# Patient Record
Sex: Male | Born: 1970 | Race: White | Hispanic: No | Marital: Single | Smoking: Never smoker
Health system: Southern US, Community
[De-identification: ages and names within clinical notes are randomized; demographics above are authoritative.]

## PROBLEM LIST (undated history)

## (undated) HISTORY — PX: WISDOM TOOTH EXTRACTION: SHX21

---

## 2008-10-11 ENCOUNTER — Encounter: Admission: RE | Admit: 2008-10-11 | Discharge: 2008-10-11 | Payer: Self-pay | Admitting: Family Medicine

## 2010-06-02 DIAGNOSIS — J309 Allergic rhinitis, unspecified: Secondary | ICD-10-CM | POA: Insufficient documentation

## 2010-06-02 DIAGNOSIS — B009 Herpesviral infection, unspecified: Secondary | ICD-10-CM | POA: Insufficient documentation

## 2010-07-05 IMAGING — CR DG CHEST 2V
3 series · 3 of 3 positions shown · non-contrast
Comparison: None

CLINICAL DATA: Cough and fever.  Congestion for 2 weeks.

CHEST - 2 VIEW

[view not recorded (1 of 3)]
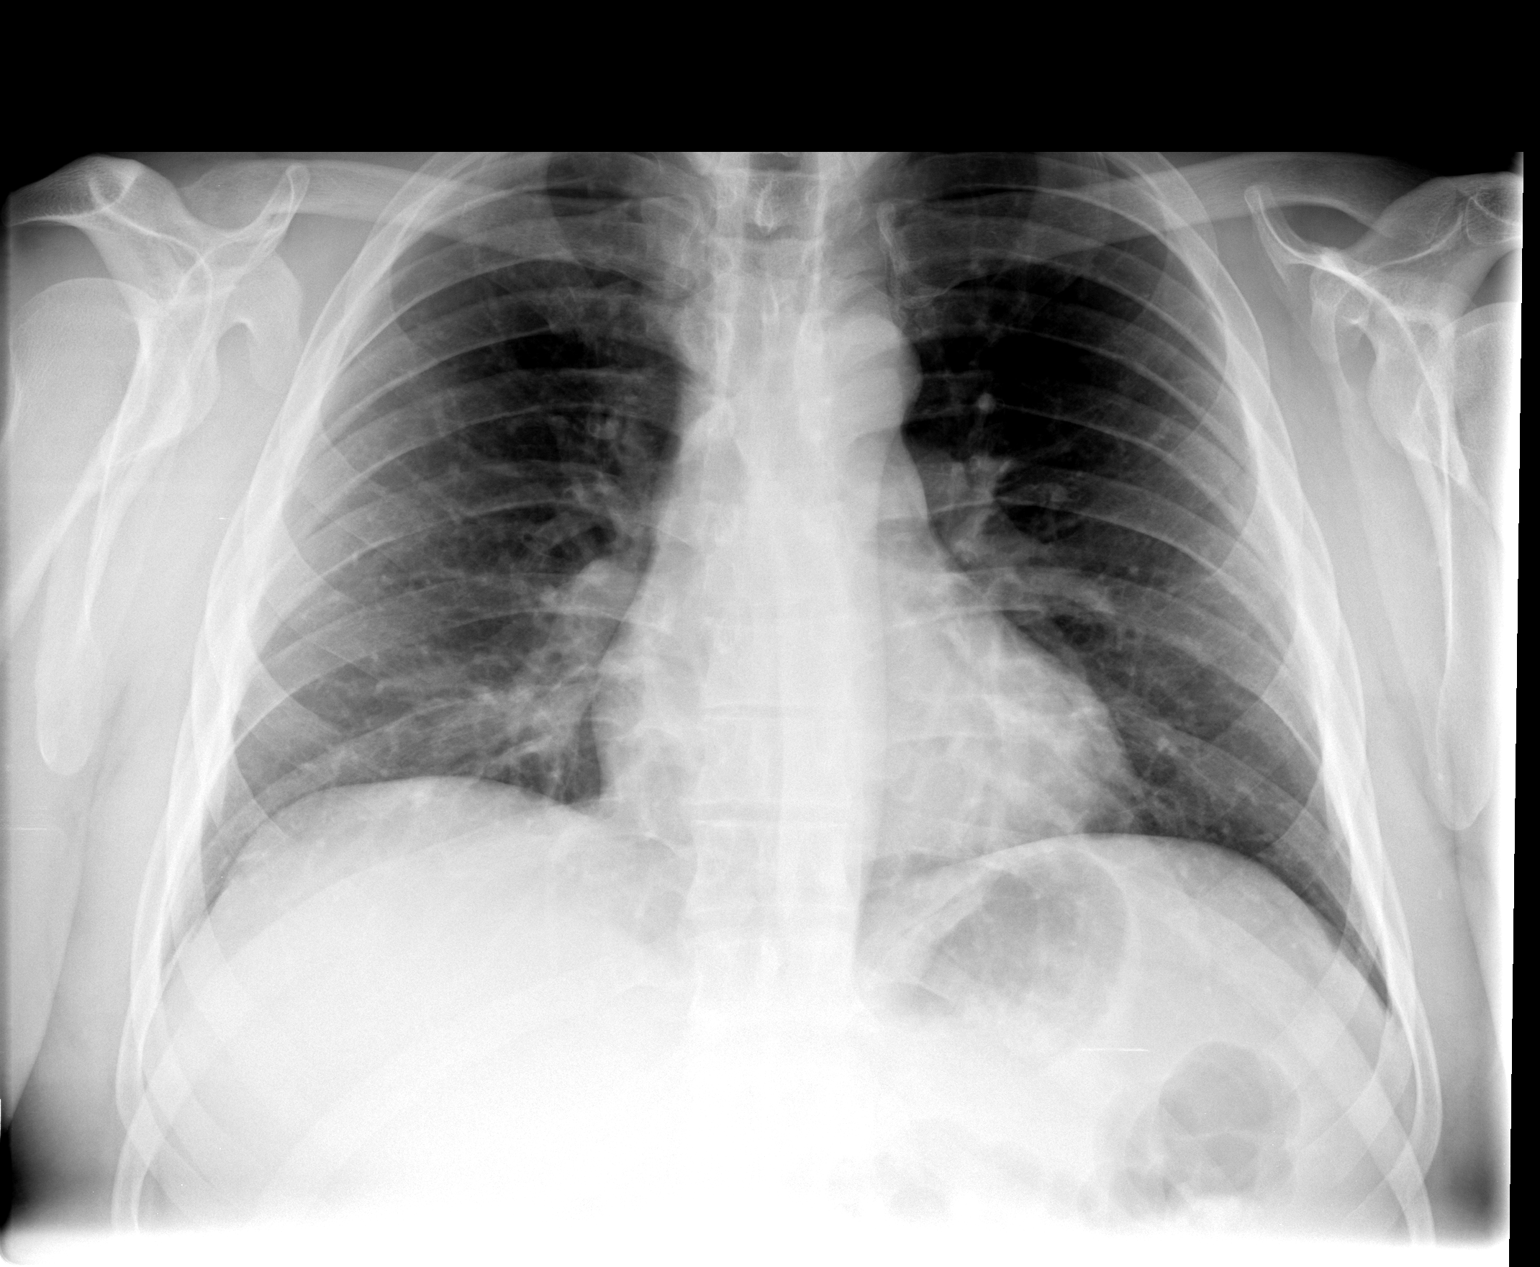

[view not recorded (2 of 3)]
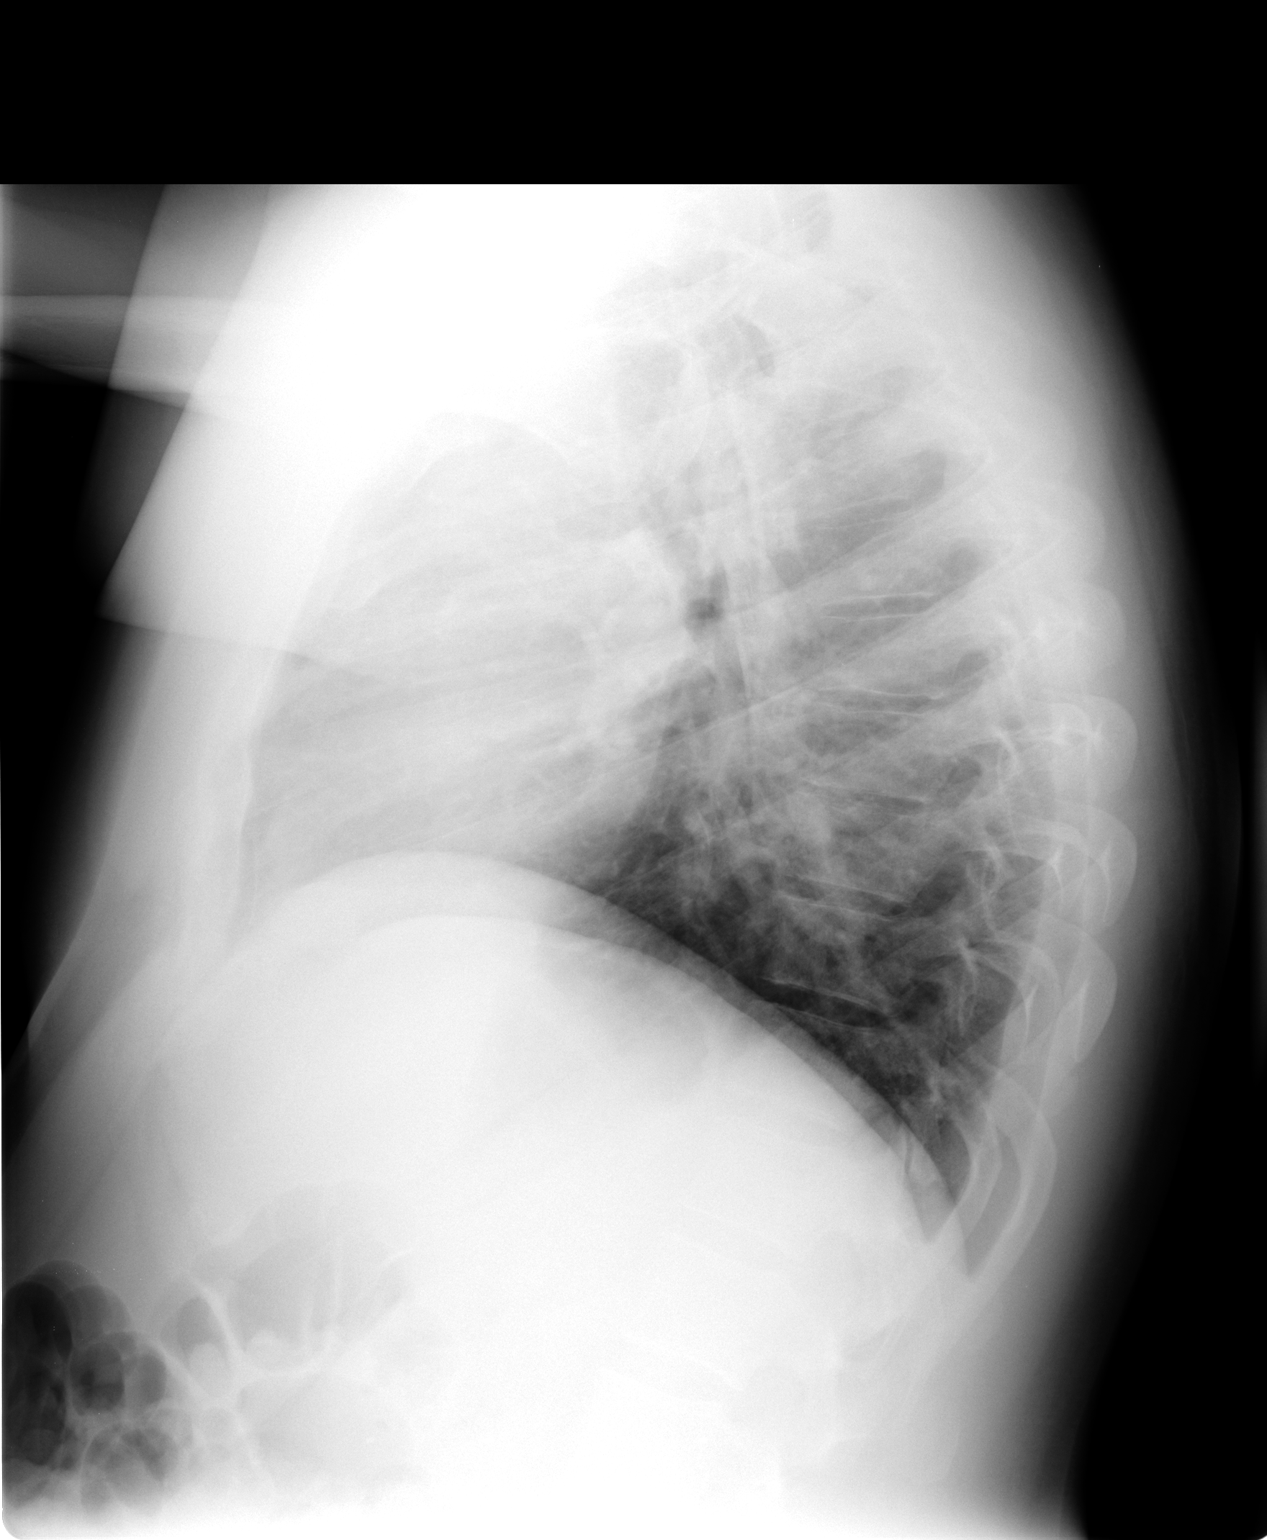

[view not recorded (3 of 3)]
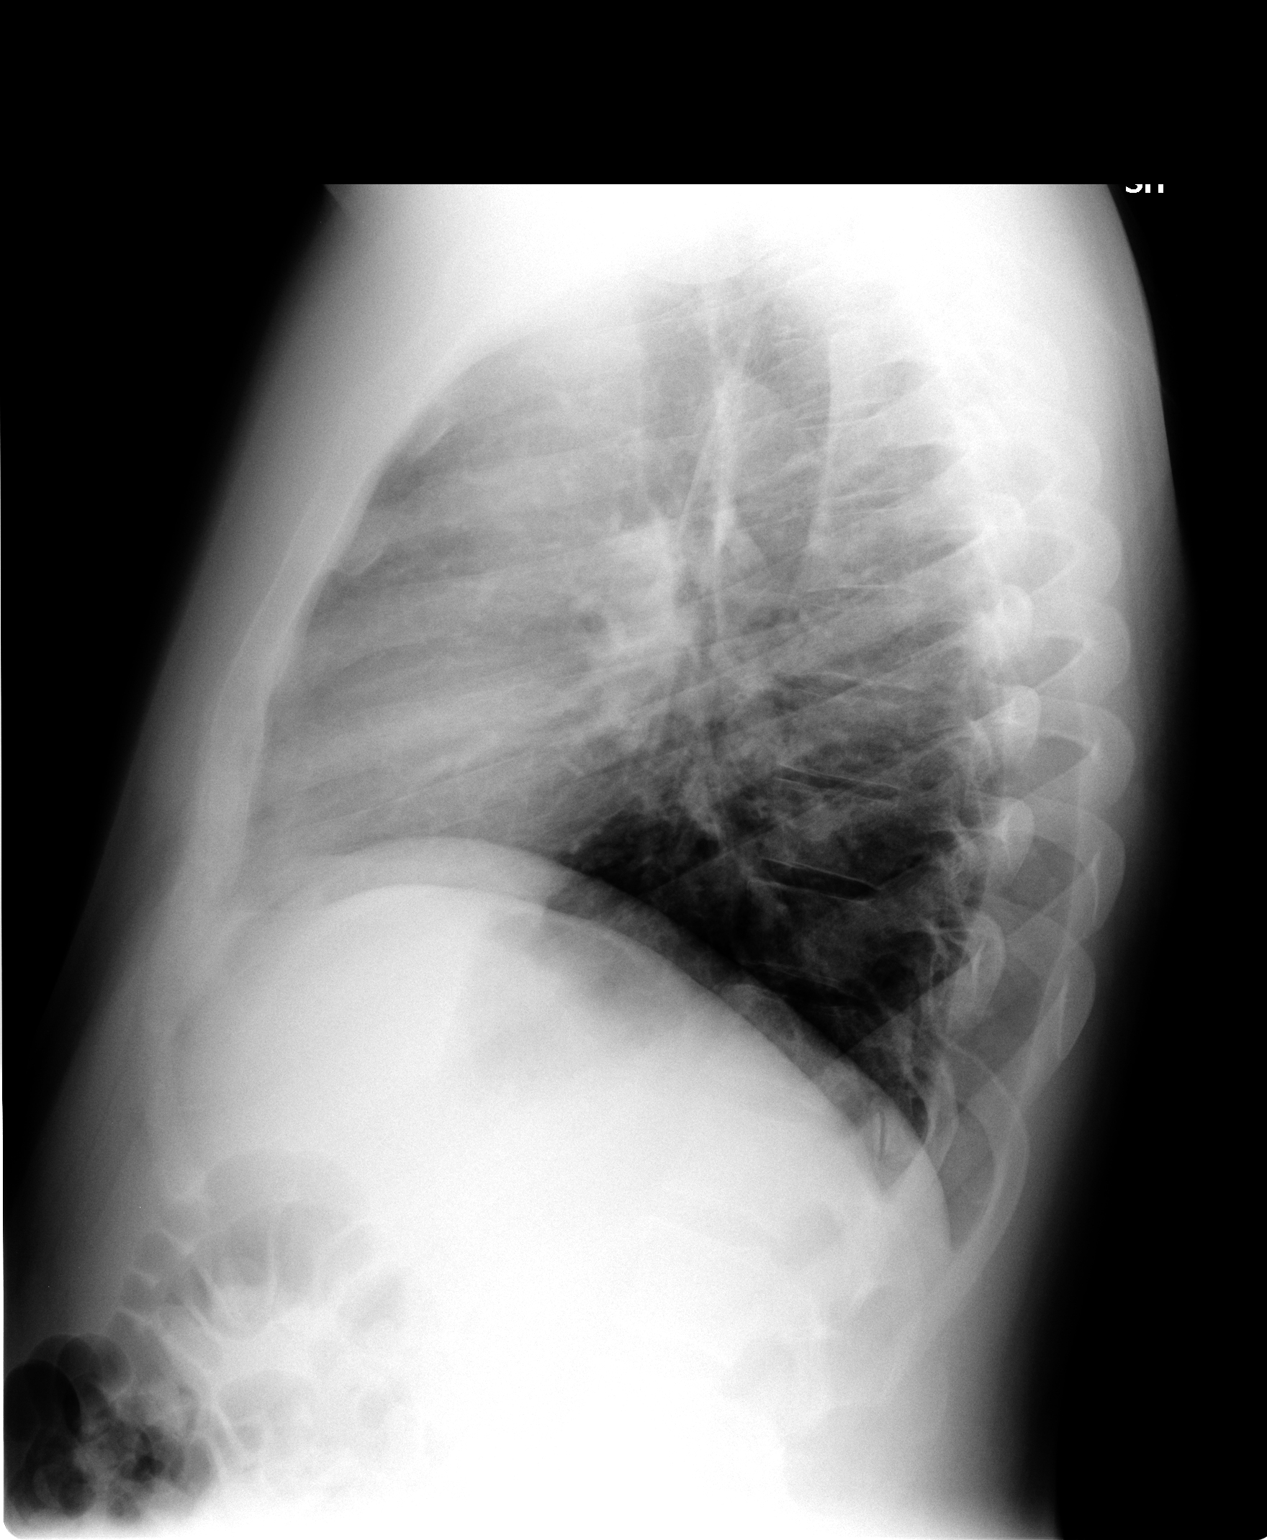

[3 of 3 positions shown; findings below may reference images not displayed]

FINDINGS: The heart size is normal.

No pleural effusion or pulmonary interstitial edema is noted.

No airspace densities identified.
IMPRESSION: 1.  No active disease.

## 2011-10-10 ENCOUNTER — Other Ambulatory Visit: Payer: Self-pay | Admitting: Gastroenterology

## 2011-10-16 ENCOUNTER — Ambulatory Visit
Admission: RE | Admit: 2011-10-16 | Discharge: 2011-10-16 | Disposition: A | Discharge status: Home / Self Care | Payer: BC Managed Care – PPO | Source: Ambulatory Visit | Attending: Gastroenterology | Admitting: Gastroenterology

## 2013-07-05 IMAGING — US US ABDOMEN COMPLETE
1 series · 14 of 25 positions shown · non-contrast
Comparison: None.

CLINICAL DATA: Elevated LFTs

COMPLETE ABDOMINAL ULTRASOUND

[Series 1: us abdomen complete · 0.30mm/px · 14 of 82 slices shown]
[im 1/82]
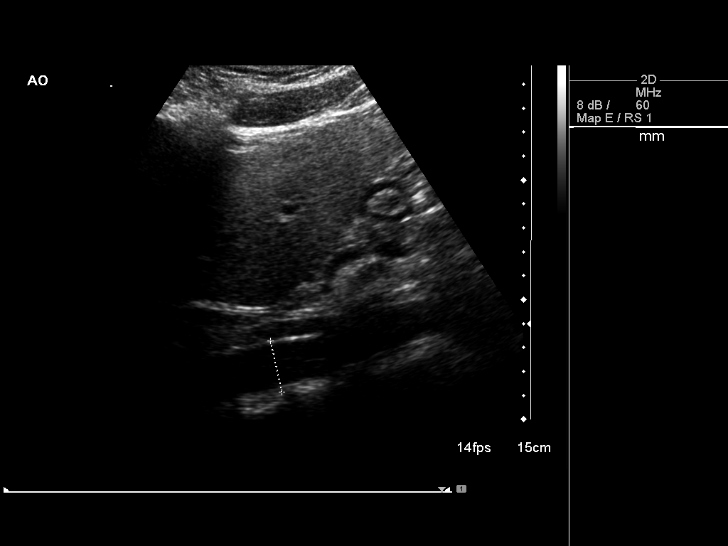
[im 7/82]
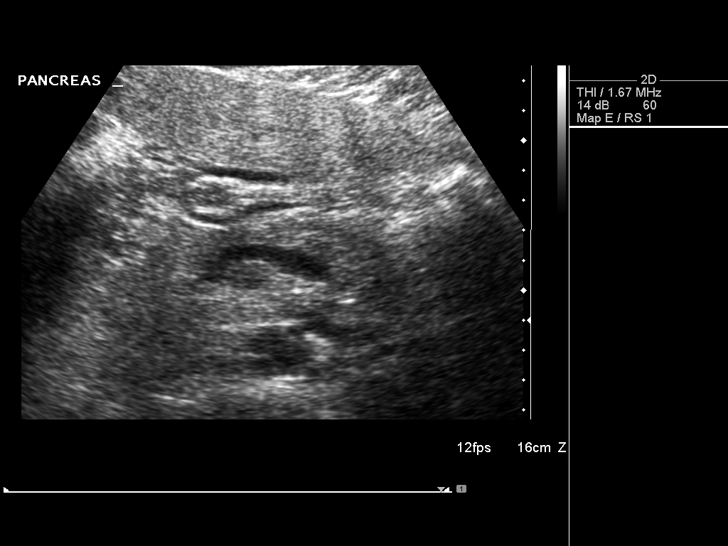
[im 14/82]
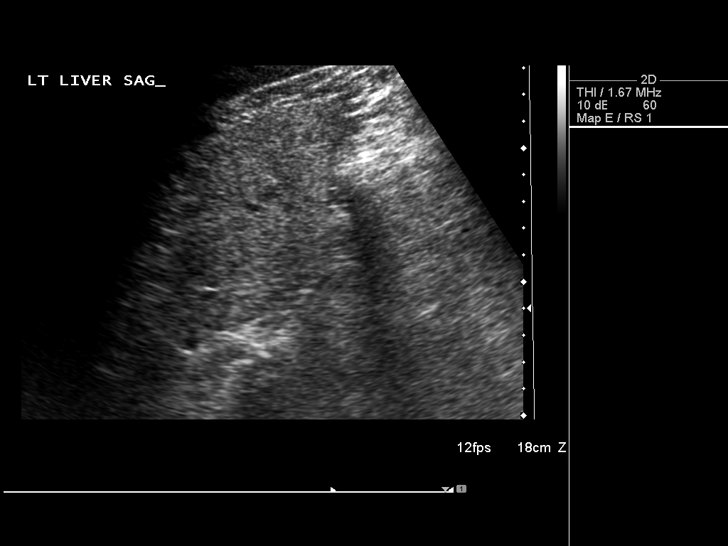
[im 21/82]
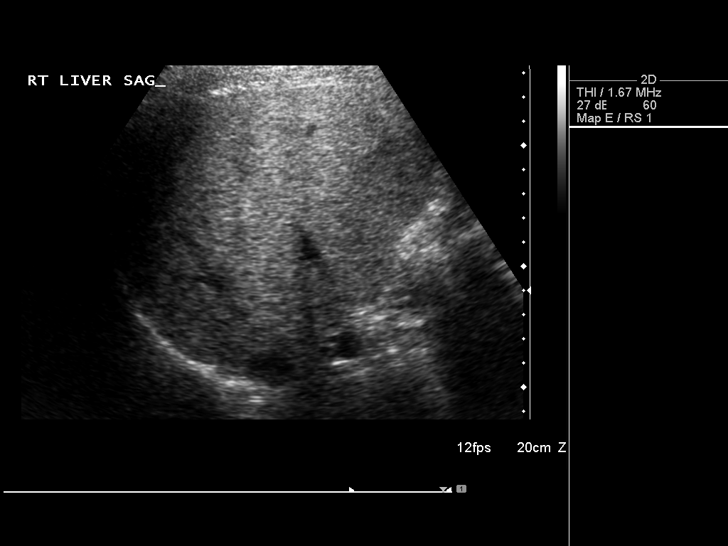
[im 28/82]
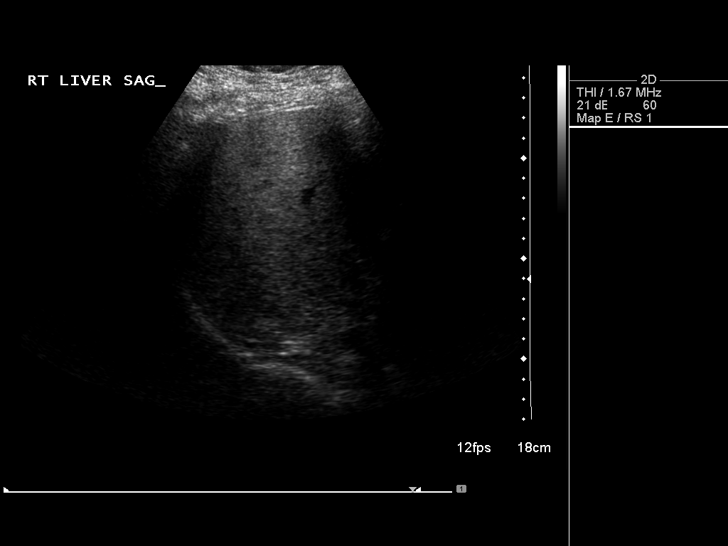
[im 31/82]
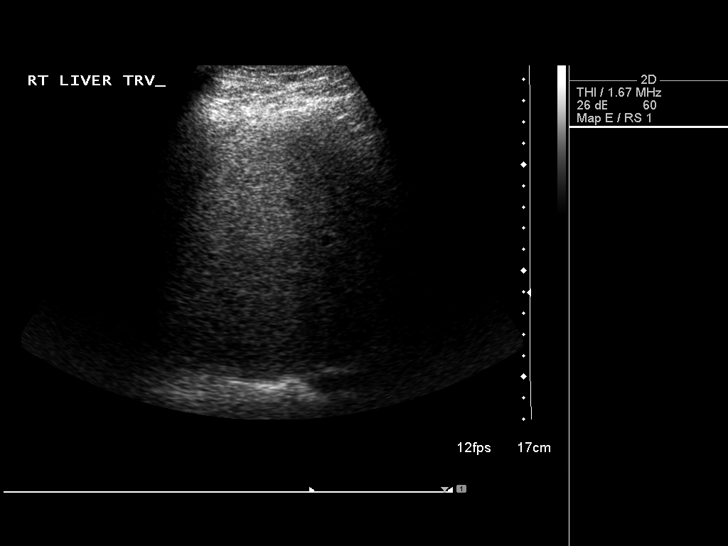
[im 38/82]
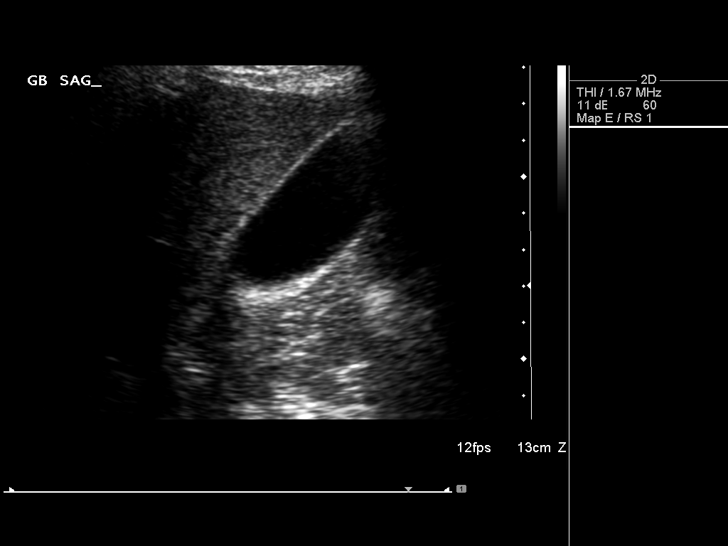
[im 44/82]
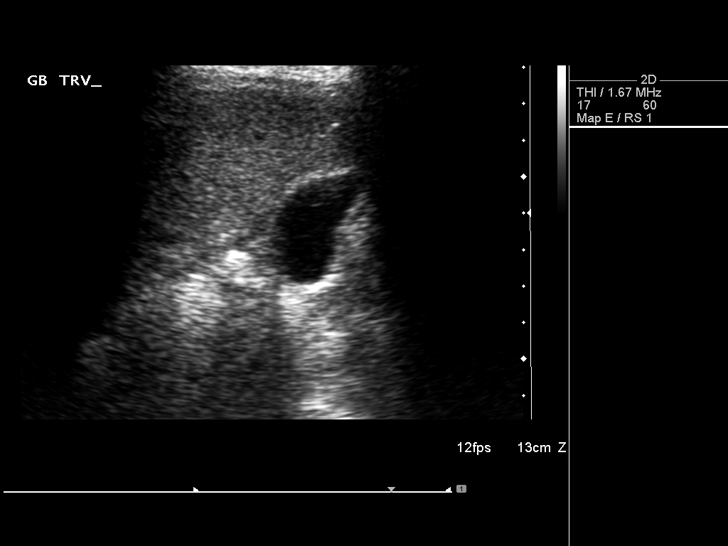
[im 51/82]
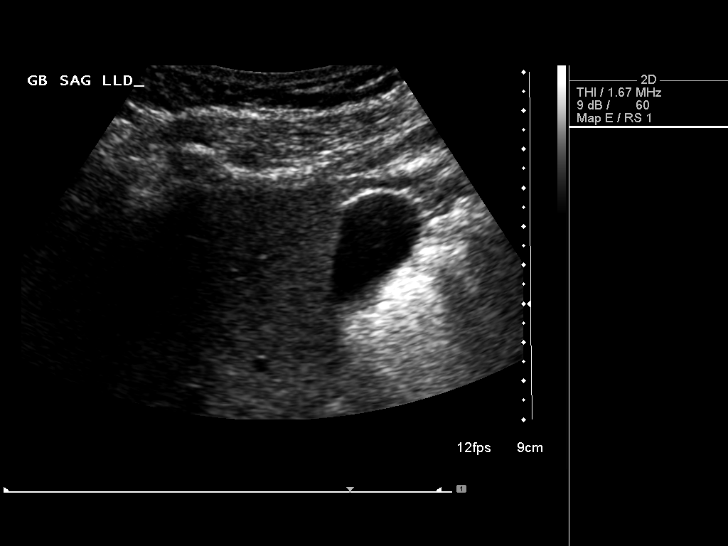
[im 55/82]
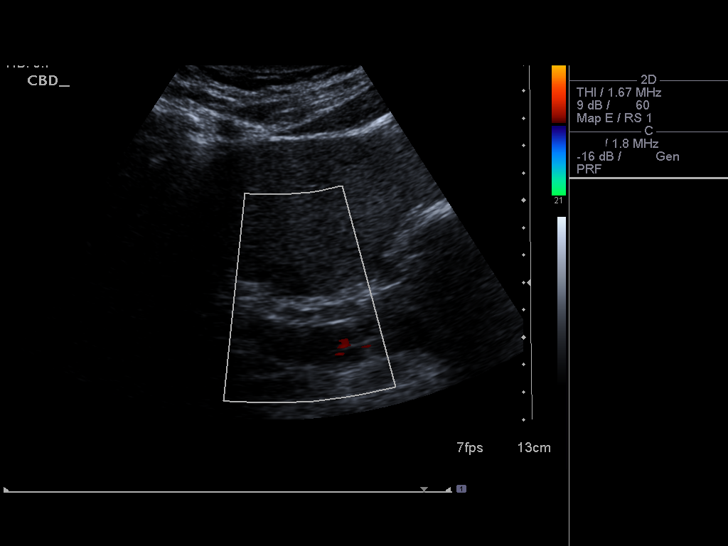
[im 61/82]
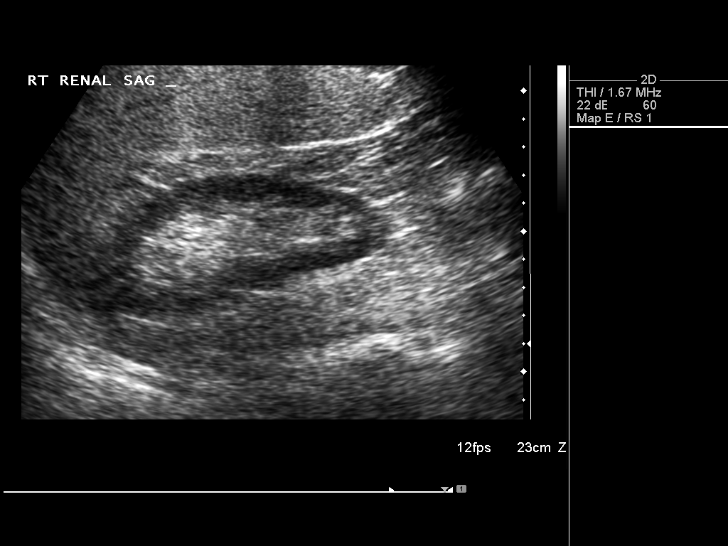
[im 68/82]
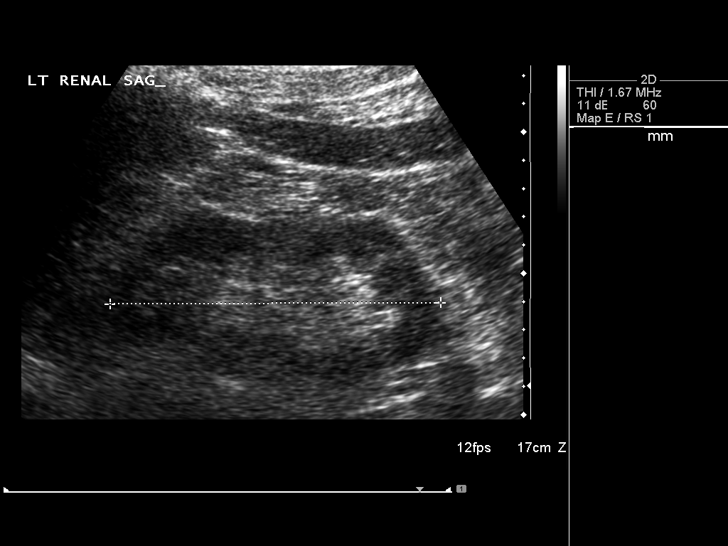
[im 75/82]
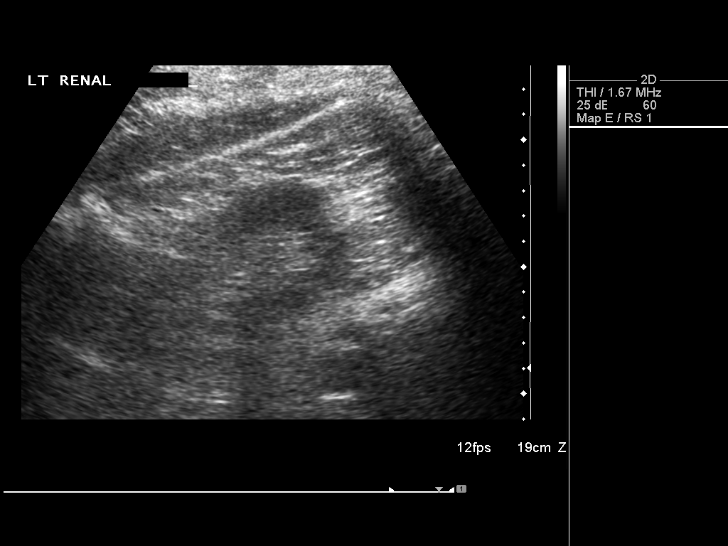
[im 82/82]
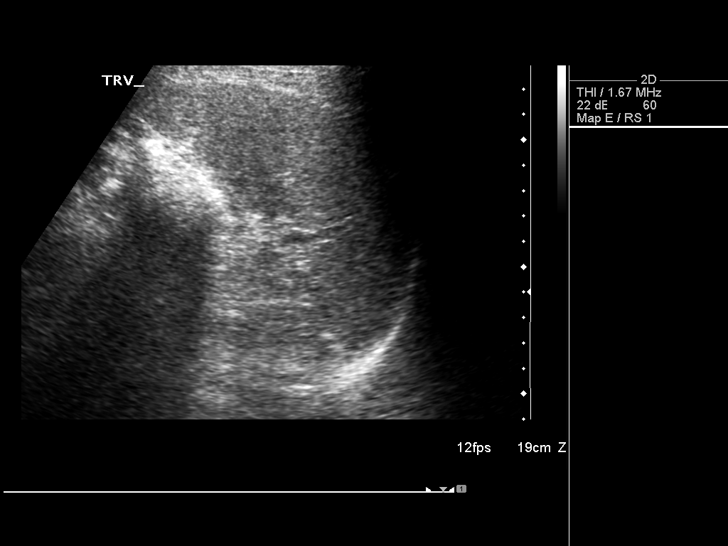

[14 of 25 positions shown; findings below may reference images not displayed]

FINDINGS: Gallbladder:  No gallstones, gallbladder wall thickening, or
pericholecystic fluid.  Negative sonographic Murphy's sign.

Common bile duct:  Measures 4 mm.

Liver:  Coarse, hyperechoic hepatic parenchyma, likely reflecting
hepatic steatosis.  No focal hepatic lesions are seen.

IVC:  Appears normal.

Pancreas:  Incompletely visualized but grossly unremarkable.

Spleen:  Measures 9.8 cm.

Right Kidney:  Measures 11.4 cm.  No mass or hydronephrosis.

Left Kidney:  Measures 11.7 cm.  No mass or hydronephrosis.

Abdominal aorta:  No aneurysm identified.
IMPRESSION: Hepatic steatosis.

Normal sonographic appearance of the gallbladder.

## 2014-03-13 ENCOUNTER — Ambulatory Visit: Payer: Self-pay | Admitting: Physician Assistant

## 2014-03-13 LAB — DOT URINE DIP
BLOOD: NEGATIVE
Glucose,UR: NEGATIVE mg/dL (ref 0–75)
Specific Gravity: 1.025 (ref 1.003–1.030)

## 2015-01-05 ENCOUNTER — Emergency Department (HOSPITAL_COMMUNITY)
Admission: EM | Admit: 2015-01-05 | Discharge: 2015-01-05 | Disposition: A | Discharge status: Home / Self Care | Payer: Worker's Compensation | Attending: Emergency Medicine | Admitting: Emergency Medicine

## 2015-01-05 ENCOUNTER — Encounter (HOSPITAL_COMMUNITY): Payer: Self-pay | Admitting: Emergency Medicine

## 2015-01-05 DIAGNOSIS — Y288XXA Contact with other sharp object, undetermined intent, initial encounter: Secondary | ICD-10-CM | POA: Insufficient documentation

## 2015-01-05 DIAGNOSIS — S61219A Laceration without foreign body of unspecified finger without damage to nail, initial encounter: Secondary | ICD-10-CM

## 2015-01-05 DIAGNOSIS — Y9289 Other specified places as the place of occurrence of the external cause: Secondary | ICD-10-CM | POA: Diagnosis not present

## 2015-01-05 DIAGNOSIS — Z23 Encounter for immunization: Secondary | ICD-10-CM | POA: Insufficient documentation

## 2015-01-05 DIAGNOSIS — Y9389 Activity, other specified: Secondary | ICD-10-CM | POA: Insufficient documentation

## 2015-01-05 DIAGNOSIS — S61215A Laceration without foreign body of left ring finger without damage to nail, initial encounter: Secondary | ICD-10-CM | POA: Diagnosis not present

## 2015-01-05 DIAGNOSIS — Y990 Civilian activity done for income or pay: Secondary | ICD-10-CM | POA: Diagnosis not present

## 2015-01-05 MED ORDER — LIDOCAINE HCL 2 % IJ SOLN
10.0000 mL | Freq: Once | INTRAMUSCULAR | Status: AC
  Administered 2015-01-05: 200 mg via INTRADERMAL
  Filled 2015-01-05: qty 20

## 2015-01-05 MED ORDER — TETANUS-DIPHTH-ACELL PERTUSSIS 5-2.5-18.5 LF-MCG/0.5 IM SUSP
0.5000 mL | Freq: Once | INTRAMUSCULAR | Status: AC
  Administered 2015-01-05: 0.5 mL via INTRAMUSCULAR
  Filled 2015-01-05: qty 0.5

## 2015-01-05 NOTE — ED Notes (Signed)
Patient verbalized understanding of discharge instructions,.  Dressing supplies given.

## 2015-01-05 NOTE — ED Notes (Signed)
Pt cut middle finger on left hand at work on piece of metal.

## 2015-01-05 NOTE — ED Provider Notes (Signed)
CSN: [id number]     Arrival date & time 01/05/15  0708 History   First MD Initiated Contact with Patient 01/05/15 [id number]     Chief Complaint  Patient presents with  . Laceration     (Consider location/radiation/quality/duration/timing/severity/associated sxs/prior Treatment) HPI Devin Rogers is a 44 y.o. male with no medical problems, presents to ed with complaint of finger laceration. Pt states cut his finger on a sharp edge of a whiteboard in his car while on his job. Laceration is to the tip of the left ring finger. Pt unable to control the bleeding. Pressure applied. He denies being on blood thinners. No other injuries. unknonw tetanus. States no pain at this time.     History reviewed. No pertinent past medical history. History reviewed. No pertinent past surgical history. History reviewed. No pertinent family history. History  Substance Use Topics  . Smoking status: Never Smoker   . Smokeless tobacco: Not on file  . Alcohol Use: No    Review of Systems  Skin: Positive for wound.  Neurological: Negative for weakness and numbness.      Allergies  Review of patient's allergies indicates not on file.  Home Medications   Prior to Admission medications   Not on File   BP 153/95 mmHg  Pulse 82  Temp(Src) 98.3 F (36.8 C) (Oral)  Resp 17  Ht  (1.778 m)  Wt 200 lb (90.719 kg)  BMI 28.70 kg/m2  SpO2 100% Physical Exam  Constitutional: He is oriented to person, place, and time. He appears well-developed and well-nourished. No distress.  Eyes: Conjunctivae are normal.  Neck: Neck supple.  Musculoskeletal:  Full rom of all fingers  Neurological: He is alert and oriented to person, place, and time.  Skin:  2cm flap laceration/partial tip amputation of the left ring finger. Small bleeding.   Nursing note and vitals reviewed.   ED Course  Procedures (including critical care time) Labs Review Labs Reviewed - No data to display  Imaging Review No  results found.   EKG Interpretation None      LACERATION REPAIR Performed by: Lottie Mussel Authorized by: Jaynie Crumble A Consent: Verbal consent obtained. Risks and benefits: risks, benefits and alternatives were discussed Consent given by: patient Patient identity confirmed: provided demographic data Prepped and Draped in normal sterile fashion Wound explored  Laceration Location: left ring finger, fingertip flap  Laceration Length: 2cm  No Foreign Bodies seen or palpated  Anesthesia: local infiltration  Local anesthetic: lidocaine 2% wo epinephrine  Anesthetic total: 1 ml  Irrigation method: syringe Amount of cleaning: standard  Skin closure: prolene 6.0  Number of sutures: 4  Technique: simple interrupted  Patient tolerance: Patient tolerated the procedure well with no immediate complications.   Part of the laceration is just above the tip of the finger nail. dermabond used to seal that part.   MDM   Final diagnoses:  Laceration of finger of left hand, initial encounter   Patient with laceration of the tip of the left middle finger. Repaired flap laceration with sutures. Tetanus updated. Home with outpatient follow-up. Suture removal in 7days  Filed Vitals:   01/05/15 0717 01/05/15 0720  BP: 153/95   Pulse: 82   Temp: 98.3 F (36.8 C)   TempSrc: Oral   Resp: 17   Height:  (1.778 m)   Weight: 200 lb (90.719 kg)   SpO2: 100% 100%         Lottie Mussel, PA-C 01/05/15 [phone]  Lottie Musselatyana A Shervin Cypert, PA-C 01/05/15 1146  Glynn OctaveStephen Rancour, MD 01/05/15 [phone]

## 2015-01-05 NOTE — Discharge Instructions (Signed)
Keep finger clean. Bacitracin twice a day. Follow up in 7 days for suture removal.   Laceration Care, Adult A laceration is a cut or lesion that goes through all layers of the skin and into the tissue just beneath the skin. TREATMENT  Some lacerations may not require closure. Some lacerations may not be able to be closed due to an increased risk of infection. It is important to see your caregiver as soon as possible after an injury to minimize the risk of infection and maximize the opportunity for successful closure. If closure is appropriate, pain medicines may be given, if needed. The wound will be cleaned to help prevent infection. Your caregiver will use stitches (sutures), staples, wound glue (adhesive), or skin adhesive strips to repair the laceration. These tools bring the skin edges together to allow for faster healing and a better cosmetic outcome. However, all wounds will heal with a scar. Once the wound has healed, scarring can be minimized by covering the wound with sunscreen during the day for 1 full year. HOME CARE INSTRUCTIONS  For sutures or staples:  Keep the wound clean and dry.  If you were given a bandage (dressing), you should change it at least once a day. Also, change the dressing if it becomes wet or dirty, or as directed by your caregiver.  Wash the wound with soap and water 2 times a day. Rinse the wound off with water to remove all soap. Pat the wound dry with a clean towel.  After cleaning, apply a thin layer of the antibiotic ointment as recommended by your caregiver. This will help prevent infection and keep the dressing from sticking.  You may shower as usual after the first 24 hours. Do not soak the wound in water until the sutures are removed.  Only take over-the-counter or prescription medicines for pain, discomfort, or fever as directed by your caregiver.  Get your sutures or staples removed as directed by your caregiver. For skin adhesive strips:  Keep the  wound clean and dry.  Do not get the skin adhesive strips wet. You may bathe carefully, using caution to keep the wound dry.  If the wound gets wet, pat it dry with a clean towel.  Skin adhesive strips will fall off on their own. You may trim the strips as the wound heals. Do not remove skin adhesive strips that are still stuck to the wound. They will fall off in time. For wound adhesive:  You may briefly wet your wound in the shower or bath. Do not soak or scrub the wound. Do not swim. Avoid periods of heavy perspiration until the skin adhesive has fallen off on its own. After showering or bathing, gently pat the wound dry with a clean towel.  Do not apply liquid medicine, cream medicine, or ointment medicine to your wound while the skin adhesive is in place. This may loosen the film before your wound is healed.  If a dressing is placed over the wound, be careful not to apply tape directly over the skin adhesive. This may cause the adhesive to be pulled off before the wound is healed.  Avoid prolonged exposure to sunlight or tanning lamps while the skin adhesive is in place. Exposure to ultraviolet light in the first year will darken the scar.  The skin adhesive will usually remain in place for 5 to 10 days, then naturally fall off the skin. Do not pick at the adhesive film. You may need a tetanus shot if:  You cannot remember when you had your last tetanus shot.  You have never had a tetanus shot. If you get a tetanus shot, your arm may swell, get red, and feel warm to the touch. This is common and not a problem. If you need a tetanus shot and you choose not to have one, there is a rare chance of getting tetanus. Sickness from tetanus can be serious. SEEK MEDICAL CARE IF:   You have redness, swelling, or increasing pain in the wound.  You see a red line that goes away from the wound.  You have yellowish-white fluid (pus) coming from the wound.  You have a fever.  You notice a bad  smell coming from the wound or dressing.  Your wound breaks open before or after sutures have been removed.  You notice something coming out of the wound such as wood or glass.  Your wound is on your hand or foot and you cannot move a finger or toe. SEEK IMMEDIATE MEDICAL CARE IF:   Your pain is not controlled with prescribed medicine.  You have severe swelling around the wound causing pain and numbness or a change in color in your arm, hand, leg, or foot.  Your wound splits open and starts bleeding.  You have worsening numbness, weakness, or loss of function of any joint around or beyond the wound.  You develop painful lumps near the wound or on the skin anywhere on your body. MAKE SURE YOU:   Understand these instructions.  Will watch your condition.  Will get help right away if you are not doing well or get worse. Document Released: 11/19/2005 Document Revised: 02/11/2012 Document Reviewed: 05/15/2011 [facility] Patient Information 2015 [place], [place]. This information is not intended to replace advice given to you by your health care provider. Make sure you discuss any questions you have with your health care provider.

## 2015-01-13 ENCOUNTER — Encounter (HOSPITAL_COMMUNITY): Payer: Self-pay | Admitting: Emergency Medicine

## 2015-01-13 ENCOUNTER — Emergency Department (HOSPITAL_COMMUNITY)
Admission: EM | Admit: 2015-01-13 | Discharge: 2015-01-13 | Disposition: A | Discharge status: Home / Self Care | Payer: Worker's Compensation | Attending: Emergency Medicine | Admitting: Emergency Medicine

## 2015-01-13 DIAGNOSIS — Z4802 Encounter for removal of sutures: Secondary | ICD-10-CM | POA: Diagnosis present

## 2015-01-13 MED ORDER — CEPHALEXIN 500 MG PO CAPS: 500.0000 mg | ORAL_CAPSULE | Freq: Four times a day (QID) | ORAL | Status: DC

## 2015-01-13 NOTE — ED Provider Notes (Signed)
CSN: [id number]     Arrival date & time 01/13/15  1606 History  This chart was scribed for Eben Burowourtney A Forcucci, PA-C working with Ethelda ChickMartha K Linker, MD by Evon Slackerrance Branch, ED Scribe. This patient was seen in room TR10C/TR10C and the patient's care was started at 4:59 PM.      Chief Complaint  Patient presents with  . Suture / Staple Removal   The history is provided by the patient. No language interpreter was used.   HPI Comments: Jossie NgGregory A Shepperson is a 44 y.o. male who presents to the Emergency Department for suture removal from 4th digit on left hand that was placed 8 days ago. 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 Pt states that the area still intermittently bleeds. Pt states that he is unsure of any drainage from the area. Pt denies any pain at this time. Denies redness, warmth, fever, chills, nausea or vomiting.   History reviewed. No pertinent past medical history. History reviewed. No pertinent past surgical history. History reviewed. No pertinent family history. History  Substance Use Topics  . Smoking status: Never Smoker   . Smokeless tobacco: Not on file  . Alcohol Use: No    Review of Systems  Constitutional: Negative for fever and chills.  Gastrointestinal: Negative for nausea and vomiting.  All other systems reviewed and are negative.    Allergies  Review of patient's allergies indicates not on file.  Home Medications   Prior to Admission medications   Medication Sig Start Date End Date Taking? Authorizing Provider  cephALEXin (KEFLEX) 500 MG capsule Take 1 capsule (500 mg total) by mouth 4 (four) times daily. 01/13/15   Shelbee Apgar A Forcucci, PA-C   BP 136/89 mmHg  Pulse 88  Temp(Src) 98.9 F (37.2 C) (Oral)  Resp 22  SpO2 99%   Physical Exam  Constitutional: He is oriented to person, place, and time. He appears well-developed and well-nourished. No distress.  HENT:  Head: Normocephalic and atraumatic.  Eyes: Conjunctivae  and EOM are normal.  Neck: Neck supple. No tracheal deviation present.  Cardiovascular: Normal rate, regular rhythm, normal heart sounds and intact distal pulses.  Exam reveals no gallop and no friction rub.   No murmur heard. Pulmonary/Chest: Effort normal and breath sounds normal. No respiratory distress. He has no wheezes. He has no rales. He exhibits no tenderness.  Musculoskeletal: Normal range of motion.  There is a 2 cm well healing laceration to the distal tip of the middle with 4 sutures.  There is a mild laceration to the end of the nail bed covered with dermabond.    Neurological: He is alert and oriented to person, place, and time.  Skin: Skin is warm and dry.  Psychiatric: He has a normal mood and affect. His behavior is normal. Judgment and thought content normal.  Nursing note and vitals reviewed.   ED Course  Procedures (including critical care time) DIAGNOSTIC STUDIES: Oxygen Saturation is 99% on RA, normal by my interpretation.    COORDINATION OF CARE: 5:11 PM-Discussed treatment plan which includes suture removal with pt at bedside and pt agreed to plan.     Labs Review Labs Reviewed - No data to display  Imaging Review No results found.   EKG Interpretation None      MDM   Final diagnoses:  Visit for suture removal   Patient is a 44 year old male who presents emergency room for evaluation of wound to his finger. Patient had  stitches placed proximally 8 days ago. Wound has been healing well. He has had no discharge or drainage. There has been no redness. Patient did have some glue placed to the distal end of the fingertip as well. He has had no issues that he is aware of. 4 stitches were removed here. There is some granulation tissue versus possible infection. We'll place prophylactically on Keflex 4 times daily 7 days. Patient to return for worsening signs of infection which we have discussed. He states understanding and agreement. Patient to follow-up with  his PCP as needed. Patient stable for discharge.  I personally performed the services described in this documentation, which was scribed in my presence. The recorded information has been reviewed and is accurate.      Eben Burow, PA-C 01/13/15 1725  Ethelda Chick, MD 01/13/15 1725

## 2015-01-13 NOTE — ED Notes (Addendum)
Bandage removed from finger, laceration healing well, small white pocket on tip of finger noted.

## 2015-01-13 NOTE — Discharge Instructions (Signed)

## 2015-01-13 NOTE — ED Notes (Addendum)
Pt here for suture removal from ring finger on left hand. Pt states area still somewhat tender.

## 2016-03-08 ENCOUNTER — Ambulatory Visit
Admission: EM | Admit: 2016-03-08 | Discharge: 2016-03-08 | Disposition: A | Discharge status: Home / Self Care | Payer: BC Managed Care – PPO | Attending: Family Medicine | Admitting: Family Medicine

## 2016-03-08 ENCOUNTER — Encounter: Payer: Self-pay | Admitting: Emergency Medicine

## 2016-03-08 DIAGNOSIS — Z024 Encounter for examination for driving license: Secondary | ICD-10-CM

## 2016-03-08 DIAGNOSIS — Z029 Encounter for administrative examinations, unspecified: Secondary | ICD-10-CM

## 2016-03-08 LAB — DEPT OF TRANSP DIPSTICK, URINE (ARMC ONLY)
Glucose, UA: NEGATIVE mg/dL
HGB URINE DIPSTICK: NEGATIVE
PROTEIN: NEGATIVE mg/dL
Specific Gravity, Urine: 1.02 (ref 1.005–1.030)

## 2016-03-08 NOTE — ED Provider Notes (Signed)
CSN: [id number]     Arrival date & time 03/08/16  [id number] History   First MD Initiated Contact with Patient 03/08/16 1138     Chief Complaint  Patient presents with  . 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 He is not taking any prescription medications and denies any surgeries.  History reviewed. No pertinent past medical history. Past Surgical History  Procedure Laterality Date  . Wisdom tooth extraction     History reviewed. No pertinent family history. Social History  Substance Use Topics  . Smoking status: Never Smoker   . Smokeless tobacco: None  . Alcohol Use: No    Review of Systems  All other systems reviewed and are negative.   Allergies  Review of patient's allergies indicates no known allergies.  Home Medications   Prior to Admission medications   Medication Sig Start Date End Date Taking? Authorizing Provider  cephALEXin (KEFLEX) 500 MG capsule Take 1 capsule (500 mg total) by mouth 4 (four) times daily. 01/13/15   Terri Piedraourtney Forcucci, PA-C   Meds Ordered and Administered this Visit  Medications - No data to display  BP 115/76 mmHg  Pulse 83  Temp(Src) 97.4 F (36.3 C) (Tympanic)  Resp 16  Ht 5' 9.5" (1.765 m)  Wt 217 lb (98.431 kg)  BMI 31.60 kg/m2  SpO2 100% No data found.   Physical Exam  Constitutional:  Referred to DOT physical sheet  Nursing note and vitals reviewed.   ED Course  Procedures (including critical care time)  Labs Review Labs Reviewed  DEPT OF TRANSP DIPSTICK, URINE([facility] ONLY)    Imaging Review No results found.   Visual Acuity Review  Right Eye Distance: 20/30 uncorrected Left Eye Distance: 20/20 uncorrected Bilateral Distance: 20/15 uncorrected  Right Eye Near:   Left Eye Near:    Bilateral Near:         MDM   1. Driver's permit physical examination         Lutricia FeilWilliam P Kemiah Booz, PA-C 03/08/16 1201

## 2016-03-08 NOTE — ED Notes (Signed)
Patient here for DOT Physical.  

## 2018-05-31 ENCOUNTER — Encounter (HOSPITAL_COMMUNITY): Payer: Self-pay | Admitting: Emergency Medicine

## 2018-05-31 ENCOUNTER — Ambulatory Visit (HOSPITAL_COMMUNITY)
Admission: EM | Admit: 2018-05-31 | Discharge: 2018-05-31 | Disposition: A | Discharge status: Home / Self Care | Payer: BC Managed Care – PPO | Attending: Family Medicine | Admitting: Family Medicine

## 2018-05-31 ENCOUNTER — Other Ambulatory Visit: Payer: Self-pay

## 2018-05-31 DIAGNOSIS — R21 Rash and other nonspecific skin eruption: Secondary | ICD-10-CM

## 2018-05-31 MED ORDER — HYDROCORTISONE 2.5 % EX CREA: TOPICAL_CREAM | Freq: Two times a day (BID) | CUTANEOUS | 0 refills | Status: AC

## 2018-05-31 MED ORDER — PREDNISONE 50 MG PO TABS: 50.0000 mg | ORAL_TABLET | Freq: Every day | ORAL | 0 refills | Status: AC

## 2018-05-31 MED ORDER — METHYLPREDNISOLONE SODIUM SUCC 125 MG IJ SOLR
125.0000 mg | Freq: Once | INTRAMUSCULAR | Status: AC
  Administered 2018-05-31: 125 mg via INTRAMUSCULAR

## 2018-05-31 MED ORDER — METHYLPREDNISOLONE SODIUM SUCC 125 MG IJ SOLR
INTRAMUSCULAR | Status: AC
  Filled 2018-05-31: qty 2

## 2018-05-31 NOTE — Discharge Instructions (Signed)
This rash may take close to 2 weeks to resolve  We gave you a shot of solumedrol. Please begin prednisone tomorrow  Apply hydrocortisone cream to spots significantly itchy  May try calamine lotion or oatmeal baths to help sooth discomfort  Please return if rash continuing to spread, spreading on the face, near her eyes are worsening.

## 2018-05-31 NOTE — ED Triage Notes (Signed)
Complains of a poison ivy rash

## 2018-06-01 NOTE — ED Provider Notes (Signed)
[facility]    CSN: [id number] Arrival date & time: 05/31/18  1851     History   Chief Complaint Chief Complaint  Patient presents with  . Rash    HPI Devin Rogers is a 47 y.o. male presenting today for evaluation of rash.  He states that over the past 2 to 3 days he has developed a rash to his upper extremities, abdomen as well as lower extremities.  He believes he was exposed to poison ivy as he was doing some yard work.  Associated with itching especially on his lower extremities.  He has not used anything yet.  He is requesting a shot for this, as this has helped him in the past.  He denies extension onto his face, but he is concerned about it spreading there.  Denies history of diabetes.  Patient is a TEFL teacherhighway patrolman.  HPI  History reviewed. No pertinent past medical history.  There are no active problems to display for this patient.   Past Surgical History:  Procedure Laterality Date  . WISDOM TOOTH EXTRACTION         Home Medications    Prior to Admission medications   Medication Sig Start Date End Date Taking? Authorizing Provider  hydrocortisone 2.5 % cream Apply topically 2 (two) times daily. 05/31/18   Jaysen Wey C, PA-C  predniSONE (DELTASONE) 50 MG tablet Take 1 tablet (50 mg total) by mouth daily for 5 days. 05/31/18 06/05/18  Sulay Brymer, Junius CreamerHallie C, PA-C    Family History No family history on file.  Social History Social History   Tobacco Use  . Smoking status: Never Smoker  Substance Use Topics  . Alcohol use: No  . Drug use: No     Allergies   Patient has no known allergies.   Review of Systems Review of Systems  Constitutional: Negative for fatigue and fever.  Eyes: Negative for redness, itching and visual disturbance.  Respiratory: Negative for shortness of breath.   Cardiovascular: Negative for chest pain and leg swelling.  Gastrointestinal: Negative for nausea and vomiting.  Musculoskeletal: Negative for  arthralgias and myalgias.  Skin: Positive for color change and rash. Negative for wound.  Neurological: Negative for dizziness, syncope, weakness, light-headedness and headaches.     Physical Exam Triage Vital Signs ED Triage Vitals  Enc Vitals Group     BP 05/31/18 1905 139/85     Pulse Rate 05/31/18 1905 89     Resp 05/31/18 1905 16     Temp 05/31/18 1905 98.3 F (36.8 C)     Temp Source 05/31/18 1905 Oral     SpO2 05/31/18 1905 95 %     Weight --      Height --      Head Circumference --      Peak Flow --      Pain Score 05/31/18 1917 0     Pain Loc --      Pain Edu? --      Excl. in GC? --    No data found.  Updated Vital Signs BP 139/85 (BP Location: Right Arm)   Pulse 89   Temp 98.3 F (36.8 C) (Oral)   Resp 16   SpO2 95%   Visual Acuity Right Eye Distance:   Left Eye Distance:   Bilateral Distance:    Right Eye Near:   Left Eye Near:    Bilateral Near:     Physical Exam  Constitutional: He is oriented to person, place,  and time. He appears well-developed and well-nourished.  No acute distress  HENT:  Head: Normocephalic and atraumatic.  Nose: Nose normal.  Eyes: Conjunctivae are normal.  Neck: Neck supple.  Cardiovascular: Normal rate.  Pulmonary/Chest: Effort normal. No respiratory distress.  Abdominal: He exhibits no distension.  Musculoskeletal: Normal range of motion.  Neurological: He is alert and oriented to person, place, and time.  Skin: Skin is warm and dry.  Erythematous vesicular lesions on erythematous base to bilateral upper extremities, lower extremity with more welt-like raised erythematous lesions to proximal thighs, chest with similar lesions to arms seen in pictures below  Psychiatric: He has a normal mood and affect.  Nursing note and vitals reviewed.        UC Treatments / Results  Labs (all labs ordered are listed, but only abnormal results are displayed) Labs Reviewed - No data to  display  EKG None  Radiology No results found.  Procedures Procedures (including critical care time)  Medications Ordered in UC Medications  methylPREDNISolone sodium succinate (SOLU-MEDROL) 125 mg/2 mL injection 125 mg (125 mg Intramuscular Given 05/31/18 1957)    Initial Impression / Assessment and Plan / UC Course  I have reviewed the triage vital signs and the nursing notes.  Pertinent labs & imaging results that were available during my care of the patient were reviewed by me and considered in my medical decision making (see chart for details).     Rash appears to be contact dermatitis, possible poison oak exposure versus other bug bites.  Will provide shot of Solu-Medrol as this is helped in the past, but discussed that given that it was not involved on his face that oral steroids may be sufficient.  We will also provide 3 days of prednisone to continue tomorrow.  Hydrocortisone cream to use on spots that are significantly itchy, advised to use a thin amount and only twice a day.  May also try calamine or oatmeal baths to help with discomfort.  Monitor rash and return if worsening, changing spreading on face.  Advised if this is to poison oak it may last for close to 2 weeks.Discussed strict return precautions. Patient verbalized understanding and is agreeable with plan.  Final Clinical Impressions(s) / UC Diagnoses   Final diagnoses:  Rash and nonspecific skin eruption     Discharge Instructions     This rash may take close to 2 weeks to resolve  We gave you a shot of solumedrol. Please begin prednisone tomorrow  Apply hydrocortisone cream to spots significantly itchy  May try calamine lotion or oatmeal baths to help sooth discomfort  Please return if rash continuing to spread, spreading on the face, near her eyes are worsening.   ED Prescriptions    Medication Sig Dispense Auth. Provider   predniSONE (DELTASONE) 50 MG tablet Take 1 tablet (50 mg total) by mouth  daily for 5 days. 3 tablet Roy Tokarz C, PA-C   hydrocortisone 2.5 % cream Apply topically 2 (two) times daily. 30 g Amram Maya, [place] C, PA-C     Controlled Substance Prescriptions [place] Controlled Substance Registry consulted? Not Applicable   Lew Dawes, [place] 06/01/18 1042

## 2021-11-05 ENCOUNTER — Ambulatory Visit
Admission: EM | Admit: 2021-11-05 | Discharge: 2021-11-05 | Disposition: A | Discharge status: Home / Self Care | Payer: BC Managed Care – PPO | Attending: Emergency Medicine | Admitting: Emergency Medicine

## 2021-11-05 ENCOUNTER — Other Ambulatory Visit: Payer: Self-pay

## 2021-11-05 DIAGNOSIS — B349 Viral infection, unspecified: Secondary | ICD-10-CM | POA: Diagnosis not present

## 2021-11-05 MED ORDER — OSELTAMIVIR PHOSPHATE 75 MG PO CAPS: 75.0000 mg | ORAL_CAPSULE | Freq: Two times a day (BID) | ORAL | 0 refills | Status: DC

## 2021-11-05 NOTE — ED Triage Notes (Signed)
Pt here with C/O cough, rib cage hurts when coughing, body aches since Thursday. States he had fever last night 100.4.  Thinks a coworker had the flu.

## 2021-11-05 NOTE — ED Provider Notes (Signed)
[facility] [place]   Name: Devin Rogers DOB: 1971/09/08 MRN: [id number] CSN: [id number] PCP: Pcp, No  Arrival date and time:  11/05/21 0901  Chief Complaint:  Cough and Fever   NOTE: Prior to seeing the patient today, I have reviewed the triage nursing documentation and vital signs. Clinical staff has updated patient's PMH/PSHx, current medication list, and drug allergies/intolerances to ensure comprehensive history available to assist in medical decision making.   History:   HPI: Devin Rogers is a 50 y.o. male who presents today with complaints of chills, body aches and fever x3 days.  T-max at home = 102.7 F.  Patient believes he was exposed to the flu at work.  He denies any pain when breathing; endorses coughing.  There is a low amount of viral testing available at this facility at this time.  Patient has no severe comorbidities, therefore viral testing tonight.   History reviewed. No pertinent past medical history.  Past Surgical History:  Procedure Laterality Date   WISDOM TOOTH EXTRACTION      History reviewed. No pertinent family history.  Social History   Tobacco Use   Smoking status: Never  Vaping Use   Vaping Use: Never used  Substance Use Topics   Alcohol use: No   Drug use: No    There are no problems to display for this patient.   Home Medications:    No outpatient medications have been marked as taking for the 11/05/21 encounter [facility] Encounter).    Allergies:   Patient has no known allergies.  Review of Systems (ROS): Review of Systems  Constitutional:  Positive for chills, fatigue and fever. Negative for activity change, appetite change and diaphoresis.  HENT:  Negative for congestion, postnasal drip, sinus pain, sneezing and sore throat.   Respiratory:  Positive for cough. Negative for shortness of breath and wheezing.   Cardiovascular:  Negative for chest pain.  Gastrointestinal:  Negative for diarrhea,  nausea and vomiting.  All other systems reviewed and are negative.   Vital Signs: Today's Vitals   11/05/21 1013 11/05/21 1015  BP:  130/90  Pulse:  96  Resp:  18  Temp:  (!) 101.2 F (38.4 C)  TempSrc:  Oral  SpO2:  100%  Weight:  215 lb (97.5 kg)  Height:  5\' 10"  (1.778 m)  PainSc: 3      Physical Exam: Physical Exam Vitals and nursing note reviewed.  Constitutional:      Appearance: Normal appearance. He is ill-appearing.  HENT:     Right Ear: Tympanic membrane normal.     Left Ear: Tympanic membrane normal.     Mouth/Throat:     Pharynx: Oropharynx is clear. No oropharyngeal exudate or posterior oropharyngeal erythema.  Eyes:     Pupils: Pupils are equal, round, and reactive to light.  Cardiovascular:     Rate and Rhythm: Regular rhythm. Tachycardia present.     Pulses: Normal pulses.     Heart sounds: Normal heart sounds.  Pulmonary:     Effort: Pulmonary effort is normal. No respiratory distress.     Breath sounds: Normal breath sounds.  Skin:    General: Skin is warm and dry.  Neurological:     General: No focal deficit present.     Mental Status: He is alert and oriented to person, place, and time.  Psychiatric:        Mood and Affect: Mood normal.  Behavior: Behavior normal.     Urgent Care Treatments / Results:   LABS: PLEASE NOTE: all labs that were ordered this encounter are listed, however only abnormal results are displayed. Labs Reviewed - No data to display  EKG: -None  RADIOLOGY: No results found.  PROCEDURES: Procedures  MEDICATIONS RECEIVED THIS VISIT: Medications - No data to display  PERTINENT CLINICAL COURSE NOTES/UPDATES:   Initial Impression / Assessment and Plan / Urgent Care Course:  Pertinent labs & imaging results that were available during my care of the patient were personally reviewed by me and considered in my medical decision making (see lab/imaging section of note for values and interpretations).  Devin Rogers is a 50 y.o. male who presents to [facility] today with complaints of cough, fever and body aches, diagnosed with influenza-like illness, and treated as such with the medications below. NP and patient reviewed discharge instructions below during visit.   Patient is well appearing overall in clinic today. He does not appear to be in any acute distress. Presenting symptoms (see HPI) and exam as documented above.   I have reviewed the follow up and strict return precautions for any new or worsening symptoms. Patient is aware of symptoms that would be deemed urgent/emergent, and would thus require further evaluation either here or in the emergency department. At the time of discharge, he verbalized understanding and consent with the discharge plan as it was reviewed with him. All questions were fielded by provider and/or clinic staff prior to patient discharge.    Final Clinical Impressions / Urgent Care Diagnoses:   Final diagnoses:  Viral illness    New Prescriptions:  [place] Controlled Substance Registry consulted? Not Applicable  No orders of the defined types were placed in this encounter.     Discharge Instructions      You were seen for cough and fever and are being treated for possible flu.   -You are being given a prescription for Tamiflu.  Take as directed. -Continue symptomatic care such as Delsym for cough suppression, Mucinex to help with chest congestion, and Chloraseptic spray to have her sore throat. -Treat your fever and body aches by rotating Tylenol and ibuprofen. -Increase hydration.  Take care, Dr. Sharlet Salina, NP-c      Recommended Follow up Care:  Patient encouraged to follow up with the following provider within the specified time frame, or sooner as dictated by the severity of his symptoms. As always, he was instructed that for any urgent/emergent care needs, he should seek care either here or in the emergency department for more immediate  evaluation.   Bailey Mech, DNP, NP-c   Bailey Mech, NP 11/05/21 1054

## 2021-11-05 NOTE — Discharge Instructions (Signed)
You were seen for cough and fever and are being treated for possible flu.   -You are being given a prescription for Tamiflu.  Take as directed. -Continue symptomatic care such as Delsym for cough suppression, Mucinex to help with chest congestion, and Chloraseptic spray to have her sore throat. -Treat your fever and body aches by rotating Tylenol and ibuprofen. -Increase hydration.  Take care, Dr. Sharlet Salina, NP-c

## 2022-02-21 ENCOUNTER — Other Ambulatory Visit: Payer: Self-pay

## 2022-02-21 ENCOUNTER — Ambulatory Visit
Admission: EM | Admit: 2022-02-21 | Discharge: 2022-02-21 | Disposition: A | Discharge status: Home / Self Care | Payer: BC Managed Care – PPO | Attending: Emergency Medicine | Admitting: Emergency Medicine

## 2022-02-21 DIAGNOSIS — U071 COVID-19: Secondary | ICD-10-CM | POA: Insufficient documentation

## 2022-02-21 LAB — RESP PANEL BY RT-PCR (FLU A&B, COVID) ARPGX2
Influenza A by PCR: NEGATIVE
Influenza B by PCR: NEGATIVE
SARS Coronavirus 2 by RT PCR: POSITIVE — AB

## 2022-02-21 NOTE — Discharge Instructions (Addendum)
Flu test negative, positive for COVID ? ?Per the CDC it is recommended that you quarantine for 5 days to prevent spread ? ?COVID is a virus and will progressively improve with time, you may use medications to help minimize your symptoms until it resolves of course if you have any trouble breathing please return to the urgent care for reevaluation ? ?You can take Tylenol and/or Ibuprofen as needed for fever reduction and pain relief. ?  ?For cough: honey 1/2 to 1 teaspoon (you can dilute the honey in water or another fluid).  You can also use guaifenesin for cough. You can use a humidifier for chest congestion and cough.  If you don't have a humidifier, you can sit in the bathroom with the hot shower running.    ?  ?For sore throat: try warm salt water gargles, cepacol lozenges, throat spray, warm tea or water with lemon/honey, popsicles or ice, or OTC cold relief medicine for throat discomfort. ?  ?For congestion: take a daily anti-histamine like Zyrtec, Claritin. ? ?It is important to stay hydrated: drink plenty of fluids (water, gatorade/powerade/pedialyte, juices, or teas) to keep your throat moisturized and help further relieve irritation/discomfort.  ? ?

## 2022-02-21 NOTE — ED Provider Notes (Signed)
?[facility] ? ? ? ?CSN: [id number] ?Arrival date & time: 02/21/22  1041 ? ? ?  ? ?History   ?Chief Complaint ?Chief Complaint  ?Patient presents with  ? Fever  ? Nasal Congestion  ? ? ?HPI ?CALIBER LANDESS is a 51 y.o. male.  ? ?Patient presents with fever, chills, nasal congestion, sinus pressure bilateral ear fullness and a nonproductive cough for 1 day.  Tolerating food and liquids.  Denies sick contacts.  Has attempted use of Tylenol which was helpful in fever management.  No pertinent medical history.   ? ?History reviewed. No pertinent past medical history. ? ?Patient Active Problem List  ? Diagnosis Date Noted  ? Allergic rhinitis 06/02/2010  ? 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 ? ?Past Surgical History:  ?Procedure Laterality Date  ? WISDOM TOOTH EXTRACTION    ? ? ? ? ? ?Home Medications   ? ?Prior to Admission medications   ?Medication Sig Start Date End Date Taking? Authorizing Provider  ?fluticasone (FLONASE) 50 MCG/ACT nasal spray Place into the nose.    [provider]  ?hydrocortisone 2.5 % cream Apply topically 2 (two) times daily. 05/31/18   Wieters, Hallie C, PA-C  ?montelukast (SINGULAIR) 10 MG tablet Take 1 tablet by mouth daily. 11/12/12   [provider]  ?valACYclovir (VALTREX) 1000 MG tablet Take 2 tablets every 12 hours for 1 day prn outbreak 07/29/14   [provider]  ? ? ?Family History ?No family history on file. ? ?Social History ?Social History  ? ?Tobacco Use  ? Smoking status: Never  ?Vaping Use  ? Vaping Use: Never used  ?Substance Use Topics  ? Alcohol use: Yes  ?  Comment: Occ.  ? Drug use: No  ? ? ? ?Allergies   ?Oseltamivir ? ? ?Review of Systems ?Review of Systems  ?Constitutional:  Positive for chills and fever. Negative for activity change, appetite change, diaphoresis, fatigue and unexpected weight change.  ?HENT:  Positive for congestion and sinus pressure. Negative for dental problem, drooling, ear discharge, ear pain,  facial swelling, hearing loss, mouth sores, nosebleeds, postnasal drip, rhinorrhea, sinus pain, sneezing, sore throat, tinnitus, trouble swallowing and voice change.   ?Respiratory:  Positive for cough. Negative for apnea, choking, chest tightness, shortness of breath, wheezing and stridor.   ?Cardiovascular: Negative.   ?Gastrointestinal: Negative.   ?Skin: Negative.   ? ? ?Physical Exam ?Triage Vital Signs ?ED Triage Vitals  ?Enc Vitals Group  ?   BP 02/21/22 1057 138/88  ?   Pulse Rate 02/21/22 1057 90  ?   Resp 02/21/22 1057 18  ?   Temp 02/21/22 1057 100 ?F (37.8 ?C)  ?   Temp Source 02/21/22 1057 Oral  ?   SpO2 02/21/22 1057 99 %  ?   Weight 02/21/22 1055 215 lb (97.5 kg)  ?   Height 02/21/22 1055 5\' 10"  (1.778 m)  ?   Head Circumference --   ?   Peak Flow --   ?   Pain Score 02/21/22 1055 0  ?   Pain Loc --   ?   Pain Edu? --   ?   Excl. in GC? --   ? ?No data found. ? ?Updated Vital Signs ?BP 138/88 (BP Location: Left Arm)   Pulse 90   Temp 100 ?F (37.8 ?C) (Oral)   Resp 18   Ht 5\' 10"  (1.778 m)   Wt 215 lb (97.5 kg)   SpO2 99%  BMI 30.85 kg/m?  ? ?Visual Acuity ?Right Eye Distance:   ?Left Eye Distance:   ?Bilateral Distance:   ? ?Right Eye Near:   ?Left Eye Near:    ?Bilateral Near:    ? ?Physical Exam ?Constitutional:   ?   Appearance: Normal appearance.  ?HENT:  ?   Head: Normocephalic.  ?   Right Ear: Tympanic membrane, ear canal and external ear normal.  ?   Left Ear: Tympanic membrane, ear canal and external ear normal.  ?   Nose: Congestion present. No rhinorrhea.  ?   Mouth/Throat:  ?   Mouth: Mucous membranes are moist.  ?   Pharynx: Oropharynx is clear.  ?Eyes:  ?   Extraocular Movements: Extraocular movements intact.  ?Cardiovascular:  ?   Rate and Rhythm: Normal rate and regular rhythm.  ?   Pulses: Normal pulses.  ?   Heart sounds: Normal heart sounds.  ?Pulmonary:  ?   Effort: Pulmonary effort is normal.  ?   Breath sounds: Normal breath sounds.  ?Musculoskeletal:  ?   Cervical back:  Normal range of motion and neck supple.  ?Skin: ?   General: Skin is warm and dry.  ?Neurological:  ?   Mental Status: He is alert and oriented to person, place, and time. Mental status is at baseline.  ?Psychiatric:     ?   Mood and Affect: Mood normal.     ?   Behavior: Behavior normal.  ? ? ? ?UC Treatments / Results  ?Labs ?(all labs ordered are listed, but only abnormal results are displayed) ?Labs Reviewed  ?RESP PANEL BY RT-PCR (FLU A&B, COVID) [id number]  ? ? ?EKG ? ? ?Radiology ?No results found. ? ?Procedures ?Procedures (including critical care time) ? ?Medications Ordered in UC ?Medications - No data to display ? ?Initial Impression / Assessment and Plan / UC Course  ?I have reviewed the triage vital signs and the nursing notes. ? ?Pertinent labs & imaging results that were available during my care of the patient were reviewed by me and considered in my medical decision making (see chart for details). ? ?COVID-19 ? ?Confirmed by PCR, vitals are stable and patient is in no signs distress, lungs are clear to auscultation, may use over-the-counter medications for supportive care, declined use of antivirals at this time, work note given in urgent care follow-up as needed ?Final Clinical Impressions(s) / UC Diagnoses  ? ?Final diagnoses:  ?None  ? ?Discharge Instructions   ?None ?  ? ?ED Prescriptions   ?None ?  ? ?PDMP not reviewed this encounter. ?  ?Valinda Hoar, NP ?02/21/22 1158 ? ?

## 2022-02-21 NOTE — ED Triage Notes (Signed)
Patient is here for "Fever, Congestion". "I feel like its a sinus issue". Started with chills last night, Fever was "100.7 in beginning". Seem to more "congestion, post nasal, this morning". No COVID19 testing since symptoms began.  ?

## 2022-11-21 ENCOUNTER — Ambulatory Visit
Admission: EM | Admit: 2022-11-21 | Discharge: 2022-11-21 | Disposition: A | Discharge status: Home / Self Care | Payer: BC Managed Care – PPO | Attending: Emergency Medicine | Admitting: Emergency Medicine

## 2022-11-21 ENCOUNTER — Ambulatory Visit (INDEPENDENT_AMBULATORY_CARE_PROVIDER_SITE_OTHER): Payer: BC Managed Care – PPO

## 2022-11-21 DIAGNOSIS — J069 Acute upper respiratory infection, unspecified: Secondary | ICD-10-CM | POA: Insufficient documentation

## 2022-11-21 DIAGNOSIS — Z1152 Encounter for screening for COVID-19: Secondary | ICD-10-CM | POA: Insufficient documentation

## 2022-11-21 LAB — RESP PANEL BY RT-PCR (FLU A&B, COVID) ARPGX2
Influenza A by PCR: NEGATIVE
Influenza B by PCR: NEGATIVE
SARS Coronavirus 2 by RT PCR: NEGATIVE

## 2022-11-21 MED ORDER — IPRATROPIUM BROMIDE 0.06 % NA SOLN: 2.0000 | Freq: Four times a day (QID) | NASAL | 0 refills | Status: DC

## 2022-11-21 MED ORDER — PROMETHAZINE-DM 6.25-15 MG/5ML PO SYRP: 5.0000 mL | ORAL_SOLUTION | Freq: Four times a day (QID) | ORAL | 0 refills | Status: DC | PRN

## 2022-11-21 NOTE — ED Triage Notes (Signed)
Pt c/o nasal congestion & wet cough ongoing since Monday. Denies any fevers,chills or muscle aches. Exposed by son who was recently diagnosed w/walking pneumonia. Otc benadryl & decongestant w/minor relief.

## 2022-11-21 NOTE — Discharge Instructions (Signed)
Your chest x-ray was negative for pneumonia.  Your COVID and influenza PCR testing were negative.  You appear to have an upper respiratory infection.  Atrovent nasal spray, Mucinex D, saline nasal irrigation with a Lloyd Huger Med rinse and distilled water as often as you want to help prevent a bacterial sinus infection.  Promethazine DM for the cough.  May return here if you start to get worse if you are still sick in a week and we can reevaluate for serial sinus infection or pneumonia.

## 2022-11-21 NOTE — ED Provider Notes (Signed)
HPI  SUBJECTIVE:  Devin Rogers is a 51 y.o. male who presents with 2 to 3 days of nasal congestion, green/yellow/clear rhinorrhea, sore throat, postnasal drip, cough productive of the same material as his nasal congestion.  No fevers, sinus pain and pressure, facial swelling, upper dental pain, wheezing, shortness of breath, dyspnea on exertion.  He is unable to sleep at night because of cough.  No allergy symptoms.  He reports temporary burning chest pain after having a coughing fit shortly after eating.  None since.  No antibiotics in the past 3 months.  No antipyretic in the past 6 hours.  He is concerned about pneumonia because his son was recently diagnosed with walking pneumonia.  He tried Benadryl, Mucinex and cough drops with some improvement in his symptoms.  No aggravating factors.  No known COVID or flu exposure.  He did not get the COVID or flu vaccines.  He has no past medical history.  He is not a smoker.  PCP: None, although he has plans to establish care with a primary care provider.  History reviewed. No pertinent past medical history.  Past Surgical History:  Procedure Laterality Date   WISDOM TOOTH EXTRACTION      History reviewed. No pertinent family history.  Social History   Tobacco Use   Smoking status: Never  Vaping Use   Vaping Use: Never used  Substance Use Topics   Alcohol use: Yes    Comment: Occ.   Drug use: No    No current facility-administered medications for this encounter.  Current Outpatient Medications:    hydrocortisone 2.5 % cream, Apply topically 2 (two) times daily., Disp: 30 g, Rfl: 0   ipratropium (ATROVENT) 0.06 % nasal spray, Place 2 sprays into both nostrils 4 (four) times daily., Disp: 15 mL, Rfl: 0   promethazine-dextromethorphan (PROMETHAZINE-DM) 6.25-15 MG/5ML syrup, Take 5 mLs by mouth 4 (four) times daily as needed for cough., Disp: 118 mL, Rfl: 0   valACYclovir (VALTREX) 1000 MG tablet, Take 2 tablets every 12 hours for 1 day  prn outbreak, Disp: , Rfl:   Allergies  Allergen Reactions   Oseltamivir      ROS  As noted in HPI.   Physical Exam  BP 115/81 (BP Location: Left Arm)   Pulse 90   Temp 98.3 F (36.8 C) (Oral)   Resp 16   Ht 5\' 10"  (1.778 m)   Wt 95.3 kg   SpO2 94%   BMI 30.13 kg/m   Constitutional: Well developed, well nourished, no acute distress Eyes:  EOMI, conjunctiva normal bilaterally HENT: Normocephalic, atraumatic,mucus membranes moist.  Positive nasal congestion.  Erythematous, swollen turbinates.  No maxillary, frontal sinus tenderness.  No obvious postnasal drip.   Respiratory: Normal inspiratory effort, Lungs clear bilaterally, good air movement.  No anterior, lateral chest wall tenderness Cardiovascular: Normal rate, regular rhythm, no murmurs rubs or gallops GI: nondistended skin: No rash, skin intact Musculoskeletal: no deformities Neurologic: Alert & oriented x 3, no focal neuro deficits Psychiatric: Speech and behavior appropriate   ED Course   Medications - No data to display  Orders Placed This Encounter  Procedures   Resp Panel by RT-PCR (Flu A&B, Covid) Anterior Nasal Swab    Standing Status:   Standing    Number of Occurrences:   1   DG Chest 2 View    Standing Status:   Standing    Number of Occurrences:   1    Order Specific Question:   Reason  for Exam (SYMPTOM  OR DIAGNOSIS REQUIRED)    Answer:   Cough, rule out pneumonia.    Results for orders placed or performed during the hospital encounter of 11/21/22 (from the past 24 hour(s))  Resp Panel by RT-PCR (Flu A&B, Covid) Anterior Nasal Swab     Status: None   Collection Time: 11/21/22 10:40 AM   Specimen: Anterior Nasal Swab  Result Value Ref Range   SARS Coronavirus 2 by RT PCR NEGATIVE NEGATIVE   Influenza A by PCR NEGATIVE NEGATIVE   Influenza B by PCR NEGATIVE NEGATIVE   DG Chest 2 View  Result Date: 11/21/2022 CLINICAL DATA:  Cough, rule out pneumonia EXAM: CHEST - 2 VIEW COMPARISON:   Chest 10/11/2008 FINDINGS: The heart size and mediastinal contours are within normal limits. Both lungs are clear. The visualized skeletal structures are unremarkable. IMPRESSION: No active cardiopulmonary disease. Electronically Signed   By: Marlan Palau M.D.   On: 11/21/2022 11:59    ED Clinical Impression  1. Viral URI with cough      ED Assessment/Plan     COVID, influenza PCR testing negative today.  Will check chest x-ray because of the oxygen saturation 94% to rule out pneumonia.  Reviewed imaging independently.  No pneumonia.  See radiology report for full details.  Patient has a normal chest x-ray.  Will treat as an upper respiratory infection with Atrovent nasal spray, Promethazine DM, saline nasal ideation, Mucinex D.  Work note for today and Friday.  Patient has Thursday off.  May return if feeling better.  Discussed labs, imaging, MDM, treatment plan, and plan for follow-up with patient. Discussed sn/sx that should prompt return to the ED. patient agrees with plan.   Meds ordered this encounter  Medications   ipratropium (ATROVENT) 0.06 % nasal spray    Sig: Place 2 sprays into both nostrils 4 (four) times daily.    Dispense:  15 mL    Refill:  0   promethazine-dextromethorphan (PROMETHAZINE-DM) 6.25-15 MG/5ML syrup    Sig: Take 5 mLs by mouth 4 (four) times daily as needed for cough.    Dispense:  118 mL    Refill:  0      *This clinic note was created using Scientist, clinical (histocompatibility and immunogenetics). Therefore, there may be occasional mistakes despite careful proofreading.  ?    Domenick Gong, MD 11/22/22 [phone]

## 2023-08-04 ENCOUNTER — Ambulatory Visit
Admission: RE | Admit: 2023-08-04 | Discharge: 2023-08-04 | Disposition: A | Discharge status: Home / Self Care | Payer: BC Managed Care – PPO | Source: Ambulatory Visit | Attending: Emergency Medicine | Admitting: Emergency Medicine

## 2023-08-04 VITALS — BP 127/81 | HR 89 | Temp 100.3°F | Ht 70.0 in | Wt 210.0 lb

## 2023-08-04 DIAGNOSIS — J069 Acute upper respiratory infection, unspecified: Secondary | ICD-10-CM

## 2023-08-04 MED ORDER — AZITHROMYCIN 250 MG PO TABS: 250.0000 mg | ORAL_TABLET | Freq: Every day | ORAL | 0 refills | Status: DC

## 2023-08-04 MED ORDER — BENZONATATE 100 MG PO CAPS: 100.0000 mg | ORAL_CAPSULE | Freq: Three times a day (TID) | ORAL | 0 refills | Status: DC

## 2023-08-04 MED ORDER — PROMETHAZINE-DM 6.25-15 MG/5ML PO SYRP: 5.0000 mL | ORAL_SOLUTION | Freq: Every evening | ORAL | 0 refills | Status: DC | PRN

## 2023-08-04 NOTE — ED Triage Notes (Signed)
Pt has a temp of 100.3. Pt declines medication

## 2023-08-04 NOTE — Discharge Instructions (Signed)
Begin azithromycin to provide coverage for bacteria  You may use Tessalon pill every 8 hours as needed to help calm coughing  May use cough syrup at bedtime for additional comfort, be mindful this will make you feel sleepy    You can take Tylenol and/or Ibuprofen as needed for fever reduction and pain relief.   For cough: honey 1/2 to 1 teaspoon (you can dilute the honey in water or another fluid).  You can also use guaifenesin and dextromethorphan for cough. You can use a humidifier for chest congestion and cough.  If you don't have a humidifier, you can sit in the bathroom with the hot shower running.      For sore throat: try warm salt water gargles, cepacol lozenges, throat spray, warm tea or water with lemon/honey, popsicles or ice, or OTC cold relief medicine for throat discomfort.   For congestion: take a daily anti-histamine like Zyrtec, Claritin, and a oral decongestant, such as pseudoephedrine.  You can also use Flonase 1-2 sprays in each nostril daily.   It is important to stay hydrated: drink plenty of fluids (water, gatorade/powerade/pedialyte, juices, or teas) to keep your throat moisturized and help further relieve irritation/discomfort. \

## 2023-08-04 NOTE — ED Provider Notes (Signed)
[facility]    CSN: [id number] Arrival date & time: 08/04/23  1116      History   Chief Complaint Chief Complaint  Patient presents with   Cough    Appointment    HPI Devin Rogers is a 52 y.o. male.   Patient presents for evaluation of a mild sore throat and a productive cough present for 7 days, began to experience fever 2 days ago.  No known sick contacts.  Symptoms began after walking on a trail with child.  Has attempted use Mucinex which has been ineffective.  History of seasonal allergies.  Denies presence of shortness of breath or wheezing.  Tolerating food and liquids.   History reviewed. No pertinent past medical history.  Patient Active Problem List   Diagnosis Date Noted   Allergic rhinitis 06/02/2010   Herpes simplex virus (HSV) infection 06/02/2010    Past Surgical History:  Procedure Laterality Date   WISDOM TOOTH EXTRACTION         Home Medications    Prior to Admission medications   Medication Sig Start Date End Date Taking? Authorizing Provider  hydrocortisone 2.5 % cream Apply topically 2 (two) times daily. 05/31/18   Wieters, Hallie C, PA-C  ipratropium (ATROVENT) 0.06 % nasal spray Place 2 sprays into both nostrils 4 (four) times daily. 11/21/22   Domenick Gong, MD  promethazine-dextromethorphan (PROMETHAZINE-DM) 6.25-15 MG/5ML syrup Take 5 mLs by mouth 4 (four) times daily as needed for cough. 11/21/22   Domenick Gong, MD  valACYclovir (VALTREX) 1000 MG tablet Take 2 tablets every 12 hours for 1 day prn outbreak 07/29/14   [provider]    Family History History reviewed. No pertinent family history.  Social History Social History   Tobacco Use   Smoking status: Never  Vaping Use   Vaping status: Never Used  Substance Use Topics   Alcohol use: Yes    Comment: Occ.   Drug use: No     Allergies   Oseltamivir   Review of Systems Review of Systems  Constitutional:  Positive for fever. Negative  for activity change, appetite change, chills, diaphoresis, fatigue and unexpected weight change.  HENT:  Positive for sore throat. Negative for congestion, dental problem, drooling, ear discharge, ear pain, facial swelling, hearing loss, mouth sores, nosebleeds, postnasal drip, rhinorrhea, sinus pressure, sinus pain, sneezing, tinnitus, trouble swallowing and voice change.   Respiratory:  Positive for cough. Negative for apnea, choking, chest tightness, shortness of breath, wheezing and stridor.   Cardiovascular: Negative.   Gastrointestinal: Negative.   Skin: Negative.   Neurological: Negative.      Physical Exam Triage Vital Signs ED Triage Vitals  Encounter Vitals Group     BP 08/04/23 1147 127/81     Systolic BP Percentile --      Diastolic BP Percentile --      Pulse Rate 08/04/23 1147 89     Resp --      Temp 08/04/23 1147 100.3 F (37.9 C)     Temp Source 08/04/23 1147 Oral     SpO2 08/04/23 1147 96 %     Weight 08/04/23 1146 210 lb (95.3 kg)     Height 08/04/23 1146 5\' 10"  (1.778 m)     Head Circumference --      Peak Flow --      Pain Score 08/04/23 1146 0     Pain Loc --      Pain Education --  Exclude from Growth Chart --    No data found.  Updated Vital Signs BP 127/81 (BP Location: Left Arm)   Pulse 89   Temp 100.3 F (37.9 C) (Oral)   Ht 5\' 10"  (1.778 m)   Wt 210 lb (95.3 kg)   SpO2 96%   BMI 30.13 kg/m   Visual Acuity Right Eye Distance:   Left Eye Distance:   Bilateral Distance:    Right Eye Near:   Left Eye Near:    Bilateral Near:     Physical Exam Constitutional:      Appearance: Normal appearance.  HENT:     Head: Normocephalic.     Right Ear: Tympanic membrane, ear canal and external ear normal.     Left Ear: Tympanic membrane, ear canal and external ear normal.     Nose: Nose normal.     Mouth/Throat:     Mouth: Mucous membranes are moist.     Pharynx: No posterior oropharyngeal erythema.  Cardiovascular:     Rate and Rhythm:  Normal rate and regular rhythm.     Pulses: Normal pulses.     Heart sounds: Normal heart sounds.  Pulmonary:     Effort: Pulmonary effort is normal.     Breath sounds: Normal breath sounds.  Musculoskeletal:     Cervical back: Neck supple.  Skin:    General: Skin is warm and dry.  Neurological:     Mental Status: He is alert and oriented to person, place, and time. Mental status is at baseline.      UC Treatments / Results  Labs (all labs ordered are listed, but only abnormal results are displayed) Labs Reviewed - No data to display  EKG   Radiology No results found.  Procedures Procedures (including critical care time)  Medications Ordered in UC Medications - No data to display  Initial Impression / Assessment and Plan / UC Course  I have reviewed the triage vital signs and the nursing notes.  Pertinent labs & imaging results that were available during my care of the patient were reviewed by me and considered in my medical decision making (see chart for details).  Acute upper respiratory infection  Patient is in no signs of distress nor toxic appearing.  Vital signs are stable.  Low suspicion for pneumonia, pneumothorax or bronchitis and therefore will defer imaging.  Viral testing deferred due to timeline of illness.  Low-grade fever 100.3 noted in triage, declined antipyretic.  I symptoms have persisted for 7 days and he has begun to experience fever will prophylactically provide bacterial coverage, azithromycin sent to pharmacy as well as Tessalon and Promethazine DM for management of coughing.  Sore throat experiences only a secondary symptom of persistent coughing therefore strep testing deferred. May use additional over-the-counter medications as needed for supportive care.  May follow-up with urgent care as needed if symptoms persist or worsen.  Note given.   Final Clinical Impressions(s) / UC Diagnoses   Final diagnoses:  None   Discharge Instructions    None    ED Prescriptions   None    PDMP not reviewed this encounter.   Valinda Hoar, NP 08/04/23 1243

## 2023-08-04 NOTE — ED Triage Notes (Signed)
PT c/o cough, temperature of 100.5, x1week  PT states that he has been trying mucinex and ibuprofen for symptoms.   Pt states that his symptoms have slowly been getting worse over the last week

## 2023-12-15 ENCOUNTER — Ambulatory Visit
Admission: EM | Admit: 2023-12-15 | Discharge: 2023-12-15 | Disposition: A | Discharge status: Home / Self Care | Payer: 59 | Attending: Emergency Medicine | Admitting: Emergency Medicine

## 2023-12-15 ENCOUNTER — Ambulatory Visit: Payer: Self-pay

## 2023-12-15 DIAGNOSIS — J09X2 Influenza due to identified novel influenza A virus with other respiratory manifestations: Secondary | ICD-10-CM

## 2023-12-15 LAB — RESP PANEL BY RT-PCR (FLU A&B, COVID) ARPGX2
Influenza A by PCR: POSITIVE — AB
Influenza B by PCR: NEGATIVE
SARS Coronavirus 2 by RT PCR: NEGATIVE

## 2023-12-15 MED ORDER — BENZONATATE 100 MG PO CAPS: 200.0000 mg | ORAL_CAPSULE | Freq: Three times a day (TID) | ORAL | 0 refills | Status: AC

## 2023-12-15 MED ORDER — IPRATROPIUM BROMIDE 0.06 % NA SOLN: 2.0000 | Freq: Four times a day (QID) | NASAL | 12 refills | Status: AC

## 2023-12-15 MED ORDER — PROMETHAZINE-DM 6.25-15 MG/5ML PO SYRP: 5.0000 mL | ORAL_SOLUTION | Freq: Four times a day (QID) | ORAL | 0 refills | Status: AC | PRN

## 2023-12-15 NOTE — Discharge Instructions (Addendum)
 Your respiratory panel was positive for influenza A.  However, you are outside the therapeutic window for antivirals.  Use over-the-counter Tylenol and/or ibuprofen according the package instructions as needed for fever or pain.  Use the Atrovent  nasal spray, 2 squirts in each nostril every 6 hours, as needed for runny nose and postnasal drip.  Use the Tessalon  Perles every 8 hours during the day.  Take them with a small sip of water.  They may give you some numbness to the base of your tongue or a metallic taste in your mouth, this is normal.  Use the Promethazine  DM cough syrup at bedtime for cough and congestion.  It will make you drowsy so do not take it during the day.  Return for reevaluation or see your primary care provider for any new or worsening symptoms.

## 2023-12-15 NOTE — ED Triage Notes (Signed)
 Sx x 3 days  Productive cough yellow mucus Runny nose Nasal and chest congestion Fever headache

## 2023-12-15 NOTE — ED Provider Notes (Signed)
 [facility]    CSN: [id number] Arrival date & time: 12/15/23  1147      History   Chief Complaint Chief Complaint  Patient presents with   Cough   Fever   Nasal Congestion    HPI Devin Rogers is a 53 y.o. male.   HPI  53 year old male with past medical history significant for allergic rhinitis presents for evaluation of respiratory symptoms that began 3 days ago.  These consist of fever with a Tmax of one 101.7, headache, nasal congestion with thick yellow nasal discharge, sore throat, and productive cough for yellow sputum.  He denies any ear pain, shortness breath, or wheezing.  History reviewed. No pertinent past medical history.  Patient Active Problem List   Diagnosis Date Noted   Allergic rhinitis 06/02/2010   Herpes simplex virus (HSV) infection 06/02/2010    Past Surgical History:  Procedure Laterality Date   WISDOM TOOTH EXTRACTION         Home Medications    Prior to Admission medications   Medication Sig Start Date End Date Taking? Authorizing Provider  benzonatate  (TESSALON ) 100 MG capsule Take 2 capsules (200 mg total) by mouth every 8 (eight) hours. 12/15/23  Yes Bernardino Ditch, NP  ipratropium (ATROVENT ) 0.06 % nasal spray Place 2 sprays into both nostrils 4 (four) times daily. 12/15/23  Yes Bernardino Ditch, NP  promethazine -dextromethorphan (PROMETHAZINE -DM) 6.25-15 MG/5ML syrup Take 5 mLs by mouth 4 (four) times daily as needed. 12/15/23  Yes Bernardino Ditch, NP  hydrocortisone  2.5 % cream Apply topically 2 (two) times daily. 05/31/18   Wieters, Hallie C, PA-C  valACYclovir (VALTREX) 1000 MG tablet Take 2 tablets every 12 hours for 1 day prn outbreak 07/29/14   [provider]    Family History History reviewed. No pertinent family history.  Social History Social History   Tobacco Use   Smoking status: Never  Vaping Use   Vaping status: Never Used  Substance Use Topics   Alcohol use: Yes    Comment: Occ.   Drug use: No      Allergies   Oseltamivir    Review of Systems Review of Systems  Constitutional:  Positive for fever.  HENT:  Positive for congestion, rhinorrhea and sore throat. Negative for ear pain.   Respiratory:  Positive for cough. Negative for shortness of breath and wheezing.   Neurological:  Positive for headaches.     Physical Exam Triage Vital Signs ED Triage Vitals  Encounter Vitals Group     BP      Systolic BP Percentile      Diastolic BP Percentile      Pulse      Resp      Temp      Temp src      SpO2      Weight      Height      Head Circumference      Peak Flow      Pain Score      Pain Loc      Pain Education      Exclude from Growth Chart    No data found.  Updated Vital Signs BP (!) 144/91 (BP Location: Right Arm)   Pulse 91   Temp 98.7 F (37.1 C) (Oral)   Resp 19   SpO2 98%   Visual Acuity Right Eye Distance:   Left Eye Distance:   Bilateral Distance:    Right Eye Near:   Left Eye Near:  Bilateral Near:     Physical Exam Vitals and nursing note reviewed.  Constitutional:      Appearance: Normal appearance. He is not ill-appearing.  HENT:     Head: Normocephalic and atraumatic.     Right Ear: Tympanic membrane, ear canal and external ear normal. There is no impacted cerumen.     Left Ear: Tympanic membrane, ear canal and external ear normal. There is no impacted cerumen.     Nose: Congestion and rhinorrhea present.     Comments: Nasal mucosa is erythematous and edematous with thick yellow discharge in both nares.    Mouth/Throat:     Mouth: Mucous membranes are moist.     Pharynx: Oropharynx is clear. Posterior oropharyngeal erythema present. No oropharyngeal exudate.     Comments: Tonsillar pillars are unremarkable.  Posterior oropharynx demonstrates erythema with yellow postnasal drip. Cardiovascular:     Rate and Rhythm: Normal rate and regular rhythm.     Pulses: Normal pulses.     Heart sounds: Normal heart sounds. No murmur  heard.    No friction rub. No gallop.  Pulmonary:     Effort: Pulmonary effort is normal.     Breath sounds: Normal breath sounds. No wheezing, rhonchi or rales.  Musculoskeletal:     Cervical back: Normal range of motion and neck supple. No tenderness.  Lymphadenopathy:     Cervical: No cervical adenopathy.  Skin:    General: Skin is warm and dry.     Capillary Refill: Capillary refill takes less than 2 seconds.     Findings: No erythema or rash.  Neurological:     General: No focal deficit present.     Mental Status: He is alert and oriented to person, place, and time.      UC Treatments / Results  Labs (all labs ordered are listed, but only abnormal results are displayed) Labs Reviewed  RESP PANEL BY RT-PCR (FLU A&B, COVID) ARPGX2 - Abnormal; Notable for the following components:      Result Value   Influenza A by PCR POSITIVE (*)    All other components within normal limits    EKG   Radiology No results found.  Procedures Procedures (including critical care time)  Medications Ordered in UC Medications - No data to display  Initial Impression / Assessment and Plan / UC Course  I have reviewed the triage vital signs and the nursing notes.  Pertinent labs & imaging results that were available during my care of the patient were reviewed by me and considered in my medical decision making (see chart for details).   Patient is a nontoxic-appearing 53 year old male presenting for evaluation of 3 days with the respiratory symptoms outlined HPI above.  He is unaware of any other sick contacts though he does attend church and he also attended a recent funeral for a location manager.  His physical exam does reveal inflammation of his upper respiratory tract with thick yellow nasal discharge.  Oropharyngeal exam reveals erythema to the posterior oropharynx with yellow postnasal drip.  Tonsillar pillars are unremarkable.  No cervical lymphadenopathy present on exam.   Cardiopulmonary exam reveals good lung sounds in all fields.  I suspect that his sputum production is secondary to postnasal drip.  I will order a respiratory panel to evaluate for the presence of COVID and influenza.  Respiratory panel is positive for influenza A.  I will discharge patient with a diagnosis of influenza A.  He is outside the therapeutic window  for antiviral so I will not prescribe Tamiflu  at this time.  I will prescribe Atrovent  nasal spray to help with nasal congestion, Tessalon  Perles and Promethazine  DM cough syrup for cough and congestion.  Tylenol and/or ibuprofen as needed for fever or pain.  Return precautions reviewed.   Final Clinical Impressions(s) / UC Diagnoses   Final diagnoses:  Influenza due to identified novel influenza A virus with other respiratory manifestations     Discharge Instructions      Your respiratory panel was positive for influenza A.  However, you are outside the therapeutic window for antivirals.  Use over-the-counter Tylenol and/or ibuprofen according the package instructions as needed for fever or pain.  Use the Atrovent  nasal spray, 2 squirts in each nostril every 6 hours, as needed for runny nose and postnasal drip.  Use the Tessalon  Perles every 8 hours during the day.  Take them with a small sip of water.  They may give you some numbness to the base of your tongue or a metallic taste in your mouth, this is normal.  Use the Promethazine  DM cough syrup at bedtime for cough and congestion.  It will make you drowsy so do not take it during the day.  Return for reevaluation or see your primary care provider for any new or worsening symptoms.      ED Prescriptions     Medication Sig Dispense Auth. Provider   benzonatate  (TESSALON ) 100 MG capsule Take 2 capsules (200 mg total) by mouth every 8 (eight) hours. 21 capsule Bernardino Ditch, NP   ipratropium (ATROVENT ) 0.06 % nasal spray Place 2 sprays into both nostrils 4 (four) times daily.  15 mL Bernardino Ditch, NP   promethazine -dextromethorphan (PROMETHAZINE -DM) 6.25-15 MG/5ML syrup Take 5 mLs by mouth 4 (four) times daily as needed. 118 mL Bernardino Ditch, NP      PDMP not reviewed this encounter.   Bernardino Ditch, NP 12/15/23 1339
